# Patient Record
Sex: Male | Born: 1950
Health system: Southern US, Community
[De-identification: ages and names within clinical notes are randomized; demographics above are authoritative.]

## PROBLEM LIST (undated history)

## (undated) DIAGNOSIS — E785 Hyperlipidemia, unspecified: Secondary | ICD-10-CM

## (undated) DIAGNOSIS — I10 Essential (primary) hypertension: Secondary | ICD-10-CM

## (undated) DIAGNOSIS — E119 Type 2 diabetes mellitus without complications: Secondary | ICD-10-CM

## (undated) HISTORY — PX: COLON SURGERY: SHX602

## (undated) HISTORY — PX: OTHER SURGICAL HISTORY: SHX169

---

## 2008-02-10 ENCOUNTER — Ambulatory Visit: Payer: Self-pay | Admitting: Gastroenterology

## 2008-04-05 ENCOUNTER — Other Ambulatory Visit: Payer: Self-pay

## 2008-04-05 ENCOUNTER — Ambulatory Visit: Payer: Self-pay | Admitting: Surgery

## 2008-04-12 ENCOUNTER — Inpatient Hospital Stay: Payer: Self-pay | Admitting: Surgery

## 2016-08-03 DIAGNOSIS — Z23 Encounter for immunization: Secondary | ICD-10-CM | POA: Diagnosis not present

## 2017-01-15 DIAGNOSIS — E1140 Type 2 diabetes mellitus with diabetic neuropathy, unspecified: Secondary | ICD-10-CM | POA: Diagnosis not present

## 2017-01-15 DIAGNOSIS — G4700 Insomnia, unspecified: Secondary | ICD-10-CM | POA: Diagnosis not present

## 2017-01-15 DIAGNOSIS — E1165 Type 2 diabetes mellitus with hyperglycemia: Secondary | ICD-10-CM | POA: Diagnosis not present

## 2017-01-15 DIAGNOSIS — I10 Essential (primary) hypertension: Secondary | ICD-10-CM | POA: Diagnosis not present

## 2017-05-01 ENCOUNTER — Encounter: Payer: Self-pay | Admitting: Emergency Medicine

## 2017-05-01 ENCOUNTER — Emergency Department
Admission: EM | Admit: 2017-05-01 | Discharge: 2017-05-01 | Disposition: A | Discharge status: Home / Self Care | Payer: BLUE CROSS/BLUE SHIELD | Attending: Emergency Medicine | Admitting: Emergency Medicine

## 2017-05-01 DIAGNOSIS — F17200 Nicotine dependence, unspecified, uncomplicated: Secondary | ICD-10-CM | POA: Insufficient documentation

## 2017-05-01 DIAGNOSIS — S0501XA Injury of conjunctiva and corneal abrasion without foreign body, right eye, initial encounter: Secondary | ICD-10-CM | POA: Diagnosis not present

## 2017-05-01 DIAGNOSIS — Y93H2 Activity, gardening and landscaping: Secondary | ICD-10-CM | POA: Insufficient documentation

## 2017-05-01 DIAGNOSIS — Y92007 Garden or yard of unspecified non-institutional (private) residence as the place of occurrence of the external cause: Secondary | ICD-10-CM | POA: Diagnosis not present

## 2017-05-01 DIAGNOSIS — E119 Type 2 diabetes mellitus without complications: Secondary | ICD-10-CM | POA: Diagnosis not present

## 2017-05-01 DIAGNOSIS — X58XXXA Exposure to other specified factors, initial encounter: Secondary | ICD-10-CM | POA: Insufficient documentation

## 2017-05-01 DIAGNOSIS — I10 Essential (primary) hypertension: Secondary | ICD-10-CM | POA: Insufficient documentation

## 2017-05-01 DIAGNOSIS — Y998 Other external cause status: Secondary | ICD-10-CM | POA: Diagnosis not present

## 2017-05-01 DIAGNOSIS — S0591XA Unspecified injury of right eye and orbit, initial encounter: Secondary | ICD-10-CM | POA: Diagnosis present

## 2017-05-01 HISTORY — DX: Essential (primary) hypertension: I10

## 2017-05-01 HISTORY — DX: Type 2 diabetes mellitus without complications: E11.9

## 2017-05-01 HISTORY — DX: Hyperlipidemia, unspecified: E78.5

## 2017-05-01 MED ORDER — ERYTHROMYCIN 5 MG/GM OP OINT
TOPICAL_OINTMENT | Freq: Once | OPHTHALMIC | Status: AC
  Administered 2017-05-01: 1 via OPHTHALMIC
  Filled 2017-05-01: qty 1

## 2017-05-01 MED ORDER — TETRACAINE HCL 0.5 % OP SOLN
OPHTHALMIC | Status: AC
  Filled 2017-05-01: qty 2

## 2017-05-01 MED ORDER — ERYTHROMYCIN 5 MG/GM OP OINT: 1.0000 "application " | TOPICAL_OINTMENT | Freq: Four times a day (QID) | OPHTHALMIC | 0 refills | Status: AC

## 2017-05-01 MED ORDER — FLUORESCEIN SODIUM 0.6 MG OP STRP
ORAL_STRIP | OPHTHALMIC | Status: AC
  Filled 2017-05-01: qty 1

## 2017-05-01 MED ORDER — FLUORESCEIN SODIUM 0.6 MG OP STRP
1.0000 | ORAL_STRIP | Freq: Once | OPHTHALMIC | Status: AC
  Administered 2017-05-01: 1 via OPHTHALMIC

## 2017-05-01 MED ORDER — TETRACAINE HCL 0.5 % OP SOLN
1.0000 [drp] | Freq: Once | OPHTHALMIC | Status: AC
  Administered 2017-05-01: 1 [drp] via OPHTHALMIC

## 2017-05-01 NOTE — ED Provider Notes (Signed)
[facility] Emergency Department Provider Note   ____________________________________________   I have reviewed the triage vital signs and the nursing notes.   HISTORY  Chief Complaint Eye Problem    HPI Matthew Gay is a 66 y.o. male presents with right eye pain, eyelid swelling that began this morning when a foreign object hit his eye while mowing the grass. Patient reported initially mild discomfort without change in visual acuity however, as the day progressed pain increased with eyelid swelling. Patient denies any change in visual acuity throughout the day. He also notes no watering of the eye or drainage.Patient denies fever, chills, headache, vision changes, chest pain, chest tightness, shortness of breath, abdominal pain, nausea and vomiting.  Past Medical History:  Diagnosis Date  . Diabetes mellitus without complication (HCC)   . Hyperlipemia   . Hypertension     There are no active problems to display for this patient.   Past Surgical History:  Procedure Laterality Date  . COLON SURGERY      Prior to Admission medications   Medication Sig Start Date End Date Taking? Authorizing Provider  erythromycin ophthalmic ointment Place 1 application into the right eye 4 (four) times daily. 05/01/17 05/06/17  Meher Kucinski, Jordan Likesraci M, PA-C    Allergies Patient has no known allergies.  No family history on file.  Social History Social History  Substance Use Topics  . Smoking status: Current Some Day Smoker  . Smokeless tobacco: Never Used  . Alcohol use Yes     Comment: Rarely    Review of Systems Constitutional: Negative for fever/chills Eyes: Right eye pain. Intact visual acuity.  ENT:  Negative for sore throat and for difficulty swallowing Cardiovascular: Denies chest pain. Respiratory: Denies cough Denies shortness of breath. Gastrointestinal: No abdominal pain. Musculoskeletal: Negative for back pain. Negative for generalized body  aches. Skin: Negative for rash. Neurological: Negative for headaches.  Negative focal weakness or numbness. Negative for loss of consciousness. Able to ambulate. ____________________________________________   PHYSICAL EXAM:  VITAL SIGNS: ED Triage Vitals  Enc Vitals Group     BP 05/01/17 2037 116/71     Pulse Rate 05/01/17 2037 98     Resp --      Temp 05/01/17 2037 97.9 F (36.6 C)     Temp Source 05/01/17 2037 Oral     SpO2 05/01/17 2037 97 %     Weight 05/01/17 2038 205 lb (93 kg)     Height 05/01/17 2038 5\' 9"  (1.753 m)     Head Circumference --      Peak Flow --      Pain Score 05/01/17 2034 6     Pain Loc --      Pain Edu? --      Excl. in GC? --     Constitutional: Alert and oriented. Well appearing and in no acute distress.  Head: Normocephalic and atraumatic. Eyes: Right eye subconjunctival hemorrhage.Marland Kitchen. PERRL. Normal extraocular movements. No evidence of papilledema on limited exam. Intact peripheral vision.  Nose: No congestion/rhinorrhea Mouth/Throat: Mucous membranes are moist. Cardiovascular: Normal rate, regular rhythm. Normal distal pulses. Respiratory: Normal respiratory effort. Gastrointestinal: Soft and nontender. Musculoskeletal: Nontender with normal range of motion in all extremities. Neurologic: Normal speech and language.  Skin:  Skin is warm, dry and intact. No rash noted. Psychiatric: Mood and affect are normal.  ____________________________________________   LABS (all labs ordered are listed, but only abnormal results are displayed)  Labs Reviewed - No data to  display ____________________________________________  EKG none ____________________________________________  RADIOLOGY none ____________________________________________   PROCEDURES  Procedure(s) performed: .Fluorescein stain eye exam performed following physical exam:  Performed by: Clois Comber Authorized by: Clois Comber Consent: Verbal consent obtained. Risks and  benefits: risks, benefits and alternatives were discussed Consent given by: patient (1) drop Tetracaine instilled followed by instillation of fluorescein dye with fluorescein strip.  Examination with Joseph Art slit lamp performed: Small corneal abrasion noted at 9.00  position of right eye. No other defects noted.  Following the exam:  Right eye irrigated with Eye Stream and initial application of erythromycin ointment applied.    Patient tolerance: Patient tolerated the procedure well with no immediate complications    Critical Care performed: no ____________________________________________   INITIAL IMPRESSION / ASSESSMENT AND PLAN / ED COURSE  Pertinent labs & imaging results that were available during my care of the patient were reviewed by me and considered in my medical decision making (see chart for details).  Patient presented with right eye pain secondary to foreign body hitting his eye while mowing earlier today. Physical exam and fluorescein stain exam revealed small corneal abrasion without change in visual acuity. Eye exam was otherwise normal. Patient given initial dose of erythromycin eye ointment during his course of care in emergency department tonight. He will be given a prescription to continue the erythromycin ointment regimen for 5 days. Patient informed of clinical course, understand medical decision-making process, and agree with plan.  Patient was advised to follow up with PCP as needed and was also advised to return to the emergency department for symptoms that change or worsen.     ____________________________________________   FINAL CLINICAL IMPRESSION(S) / ED DIAGNOSES  Final diagnoses:  Abrasion of right cornea, initial encounter       NEW MEDICATIONS STARTED DURING THIS VISIT:  Discharge Medication List as of 05/01/2017 10:05 PM    START taking these medications   Details  erythromycin ophthalmic ointment Place 1 application into the right eye 4  (four) times daily., Starting Sat 05/01/2017, Until Thu 05/06/2017, Print         Note:  This document was prepared using Dragon voice recognition software and may include unintentional dictation errors.   Ceazia Harb, Jordan Likes, PA-C 05/01/17 2320    Nita Sickle, MD 05/02/17 0100

## 2017-05-01 NOTE — ED Triage Notes (Signed)
Pt states that he was mowing this morning and thinks that a bug got into his eye. Pt is ambulatory to triage and in NAD.

## 2017-06-17 DIAGNOSIS — I10 Essential (primary) hypertension: Secondary | ICD-10-CM | POA: Diagnosis not present

## 2017-06-17 DIAGNOSIS — M79673 Pain in unspecified foot: Secondary | ICD-10-CM | POA: Diagnosis not present

## 2017-06-17 DIAGNOSIS — E1165 Type 2 diabetes mellitus with hyperglycemia: Secondary | ICD-10-CM | POA: Diagnosis not present

## 2017-06-21 DIAGNOSIS — I10 Essential (primary) hypertension: Secondary | ICD-10-CM | POA: Diagnosis not present

## 2017-06-21 DIAGNOSIS — Z0001 Encounter for general adult medical examination with abnormal findings: Secondary | ICD-10-CM | POA: Diagnosis not present

## 2017-06-21 DIAGNOSIS — E782 Mixed hyperlipidemia: Secondary | ICD-10-CM | POA: Diagnosis not present

## 2017-06-21 DIAGNOSIS — E1140 Type 2 diabetes mellitus with diabetic neuropathy, unspecified: Secondary | ICD-10-CM | POA: Diagnosis not present

## 2017-09-03 DIAGNOSIS — Z23 Encounter for immunization: Secondary | ICD-10-CM | POA: Diagnosis not present

## 2017-12-02 ENCOUNTER — Other Ambulatory Visit: Payer: Self-pay | Admitting: Internal Medicine

## 2017-12-02 NOTE — Telephone Encounter (Signed)
Pt needs refill

## 2018-01-27 ENCOUNTER — Ambulatory Visit: Payer: BLUE CROSS/BLUE SHIELD | Admitting: Nurse Practitioner

## 2018-01-27 ENCOUNTER — Encounter: Payer: Self-pay | Admitting: Nurse Practitioner

## 2018-01-27 VITALS — BP 138/96 | HR 81 | Resp 16 | Ht 67.0 in | Wt 192.6 lb

## 2018-01-27 DIAGNOSIS — E1165 Type 2 diabetes mellitus with hyperglycemia: Secondary | ICD-10-CM

## 2018-01-27 DIAGNOSIS — F5101 Primary insomnia: Secondary | ICD-10-CM

## 2018-01-27 DIAGNOSIS — E119 Type 2 diabetes mellitus without complications: Secondary | ICD-10-CM | POA: Insufficient documentation

## 2018-01-27 DIAGNOSIS — I10 Essential (primary) hypertension: Secondary | ICD-10-CM

## 2018-01-27 MED ORDER — TRAZODONE HCL 50 MG PO TABS: 50.0000 mg | ORAL_TABLET | Freq: Every evening | ORAL | 1 refills | Status: DC | PRN

## 2018-01-27 MED ORDER — TRAZODONE HCL 50 MG PO TABS: 50.0000 mg | ORAL_TABLET | Freq: Every evening | ORAL | 3 refills | Status: DC | PRN

## 2018-01-27 MED ORDER — SITAGLIP PHOS-METFORMIN HCL ER 100-1000 MG PO TB24: 1.0000 | ORAL_TABLET | Freq: Every day | ORAL | 3 refills | Status: DC

## 2018-01-27 NOTE — Progress Notes (Signed)
[facility] [address] [place], [place] [address]  Internal MEDICINE  Office Visit Note  Patient Name: Matthew Gay  [id number]  [id number]  Date of Service: 02/16/2018  Chief Complaint  Patient presents with  . Diabetes    The patient is here for routine follow up exam. He is diabetic. States that he takes his DM medication as prescribed, twice daily. He states that he forgets the afternoon dose of Glucovance 2 to 3 times per week. Does not check his blood sugars. Does not feel bad. HgbA1c is elevated today at 10.6. This is up two points from prior visit. His labs were done in 8/018 and looked good. He has eye exam scheduled for 02/18/2018. He has no physical concerns or complaints today.    Pt is here for routine follow up.    Current Medication: Outpatient Encounter Medications as of 01/27/2018  Medication Sig  . atorvastatin (LIPITOR) 20 MG tablet Take 20 mg by mouth at bedtime.   Marland Kitchen glucose blood test strip 1 each by Other route as needed for other. Use as instructed  . lisinopril-hydrochlorothiazide (PRINZIDE,ZESTORETIC) 20-12.5 MG tablet Take 1 tablet by mouth daily.  . sildenafil (VIAGRA) 100 MG tablet Take 100 mg by mouth daily as needed for erectile dysfunction.  . tizanidine (ZANAFLEX) 2 MG capsule Take 2 mg by mouth 3 (three) times daily.  . [DISCONTINUED] glyBURIDE-metformin (GLUCOVANCE) 5-500 MG tablet Take 1 tablet by mouth 2 (two) times daily with a meal.  . gabapentin (NEURONTIN) 100 MG capsule TAKE ONE CAPSULE BY MOUTH TWICE A DAY FOR DIABETIC NEUROPATHY  . SitaGLIPtin-MetFORMIN HCl (JANUMET XR) [phone] MG TB24 Take 1 tablet by mouth daily.  . [DISCONTINUED] traZODone (DESYREL) 50 MG tablet TAKE 1 TABLET BY MOUTH AT BEDTIME AS NEEDED FOR INSOMNIA  . [DISCONTINUED] traZODone (DESYREL) 50 MG tablet Take 1 tablet (50 mg total) by mouth at bedtime as needed for sleep.  . [DISCONTINUED] traZODone (DESYREL) 50 MG tablet Take 1 tablet (50 mg total) by mouth  at bedtime as needed for sleep.  . [DISCONTINUED] traZODone (DESYREL) 50 MG tablet Take 1 tablet (50 mg total) by mouth at bedtime as needed for sleep.   No facility-administered encounter medications on file as of 01/27/2018.     Surgical History: Past Surgical History:  Procedure Laterality Date  . COLON SURGERY      Medical History: Past Medical History:  Diagnosis Date  . Diabetes mellitus without complication (HCC)   . Hyperlipemia   . Hypertension     Family History: No family history on file.  Social History   Socioeconomic History  . Marital status: Married    Spouse name: Not on file  . Number of children: Not on file  . Years of education: Not on file  . Highest education level: Not on file  Occupational History  . Not on file  Social Needs  . Financial resource strain: Not on file  . Food insecurity:    Worry: Not on file    Inability: Not on file  . Transportation needs:    Medical: Not on file    Non-medical: Not on file  Tobacco Use  . Smoking status: Current Some Day Smoker    Types: Cigarettes  . Smokeless tobacco: Never Used  Substance and Sexual Activity  . Alcohol use: Yes    Comment: Rarely  . Drug use: No  . Sexual activity: Not on file  Lifestyle  . Physical activity:    Days  per week: Not on file    Minutes per session: Not on file  . Stress: Not on file  Relationships  . Social connections:    Talks on phone: Not on file    Gets together: Not on file    Attends religious service: Not on file    Active member of club or organization: Not on file    Attends meetings of clubs or organizations: Not on file    Relationship status: Not on file  . Intimate partner violence:    Fear of current or ex partner: Not on file    Emotionally abused: Not on file    Physically abused: Not on file    Forced sexual activity: Not on file  Other Topics Concern  . Not on file  Social History Narrative  . Not on file      Review of Systems   Constitutional: Negative for activity change, chills, fatigue and unexpected weight change.  HENT: Negative for congestion, postnasal drip, rhinorrhea, sneezing and sore throat.   Eyes: Negative.  Negative for redness.  Respiratory: Negative for cough, chest tightness, shortness of breath and wheezing.   Cardiovascular: Negative for chest pain and palpitations.  Gastrointestinal: Negative for abdominal pain, constipation, diarrhea, nausea and vomiting.  Endocrine:       Blood sugars running high.   Genitourinary: Negative.  Negative for dysuria and frequency.  Musculoskeletal: Negative for arthralgias, back pain, joint swelling and neck pain.  Skin: Negative for rash.  Allergic/Immunologic: Negative for environmental allergies.  Neurological: Negative for tremors, numbness and headaches.  Hematological: Negative for adenopathy. Does not bruise/bleed easily.  Psychiatric/Behavioral: Negative for behavioral problems (Depression), sleep disturbance and suicidal ideas. The patient is not nervous/anxious.     Today's Vitals   01/27/18 1531  BP: (!) 138/96  Pulse: 81  Resp: 16  SpO2: 98%  Weight: 192 lb 9.6 oz (87.4 kg)  Height: 5\' 7"  (1.702 m)    Physical Exam  Constitutional: He is oriented to person, place, and time. He appears well-developed and well-nourished. No distress.  HENT:  Head: Normocephalic and atraumatic.  Mouth/Throat: Oropharynx is clear and moist. No oropharyngeal exudate.  Eyes: Pupils are equal, round, and reactive to light. EOM are normal.  Neck: Normal range of motion. Neck supple. No JVD present. Carotid bruit is not present. No tracheal deviation present. No thyromegaly present.  Cardiovascular: Normal rate, regular rhythm and normal heart sounds. Exam reveals no gallop and no friction rub.  No murmur heard. Pulmonary/Chest: Effort normal and breath sounds normal. No respiratory distress. He has no wheezes. He has no rales. He exhibits no tenderness.   Abdominal: Soft. Bowel sounds are normal. There is no tenderness.  Musculoskeletal: Normal range of motion.  Lymphadenopathy:    He has no cervical adenopathy.  Neurological: He is alert and oriented to person, place, and time. No cranial nerve deficit.  Skin: Skin is warm and dry. He is not diaphoretic.  Psychiatric: He has a normal mood and affect. His behavior is normal. Judgment and thought content normal.  Nursing note and vitals reviewed.   Assessment/Plan: 1. Uncontrolled type 2 diabetes mellitus with hyperglycemia (HCC) - POCT HgB A1C 10.6 today. Restart Janumet XR 100/1000mg  daily. Samples and new prescription provided. Reviewed ADA diet. Encouraged him to increase routine exercise. - SitaGLIPtin-MetFORMIN HCl (JANUMET XR) [phone] MG TB24; Take 1 tablet by mouth daily.  Dispense: 30 tablet; Refill: 3  2. Primary insomnia May take prescribed trazodone as needed and as  prescribed  3. Essential hypertension Generally stable. Continue bp medication as prescribed   General Counseling: Matthew Gay verbalizes understanding of the findings of todays visit and agrees with plan of treatment. I have discussed any further diagnostic evaluation that may be needed or ordered today. We also reviewed his medications today. he has been encouraged to call the office with any questions or concerns that should arise related to todays visit.  This patient was seen by Vincent GrosHeather Darothy Courtright, FNP- C in Collaboration with Dr Lyndon CodeFozia M Khan as a part of collaborative care agreement    Orders Placed This Encounter  Procedures  . POCT HgB A1C    Meds ordered this encounter  Medications  . SitaGLIPtin-MetFORMIN HCl (JANUMET XR) [phone] MG TB24    Sig: Take 1 tablet by mouth daily.    Dispense:  30 tablet    Refill:  3    Please note that patient's DM medication is changing. Please d/c glucovance BID. Thanks.    Order Specific Question:   Supervising Provider    Answer:   Lyndon CodeKHAN, FOZIA M [[id number]]  . DISCONTD:  traZODone (DESYREL) 50 MG tablet    Sig: Take 1 tablet (50 mg total) by mouth at bedtime as needed for sleep.    Dispense:  30 tablet    Refill:  3    Order Specific Question:   Supervising Provider    Answer:   Lyndon CodeKHAN, FOZIA M [[id number]]  . DISCONTD: traZODone (DESYREL) 50 MG tablet    Sig: Take 1 tablet (50 mg total) by mouth at bedtime as needed for sleep.    Dispense:  30 tablet    Refill:  1    Order Specific Question:   Supervising Provider    Answer:   Lyndon CodeKHAN, FOZIA M [[id number]]  . DISCONTD: traZODone (DESYREL) 50 MG tablet    Sig: Take 1 tablet (50 mg total) by mouth at bedtime as needed for sleep.    Dispense:  30 tablet    Refill:  1    Order Specific Question:   Supervising Provider    Answer:   Lyndon CodeKHAN, FOZIA M [[id number]]    Time spent: 6825 Minutes     Dr Lyndon CodeFozia M Khan Internal medicine

## 2018-02-16 DIAGNOSIS — F5101 Primary insomnia: Secondary | ICD-10-CM | POA: Insufficient documentation

## 2018-02-16 DIAGNOSIS — I10 Essential (primary) hypertension: Secondary | ICD-10-CM | POA: Insufficient documentation

## 2018-02-16 DIAGNOSIS — E1165 Type 2 diabetes mellitus with hyperglycemia: Secondary | ICD-10-CM | POA: Insufficient documentation

## 2018-02-16 LAB — POCT GLYCOSYLATED HEMOGLOBIN (HGB A1C): Hemoglobin A1C: 10.6

## 2018-02-18 ENCOUNTER — Other Ambulatory Visit: Payer: Self-pay | Admitting: Nurse Practitioner

## 2018-02-18 ENCOUNTER — Telehealth: Payer: Self-pay

## 2018-02-18 DIAGNOSIS — N529 Male erectile dysfunction, unspecified: Secondary | ICD-10-CM

## 2018-02-18 MED ORDER — SILDENAFIL CITRATE 100 MG PO TABS: 100.0000 mg | ORAL_TABLET | Freq: Every day | ORAL | 3 refills | Status: DC | PRN

## 2018-02-18 NOTE — Telephone Encounter (Signed)
Request sent to heather boscia

## 2018-02-18 NOTE — Progress Notes (Signed)
Refilled patient's prescription for viagra and sent to pharmacy.

## 2018-02-18 NOTE — Telephone Encounter (Signed)
Spoke to pt and informed him that rx was sent to pharmacy.  dbs

## 2018-02-18 NOTE — Telephone Encounter (Signed)
Refilled patient's prescription for viagra and sent to pharmacy.

## 2018-02-21 ENCOUNTER — Other Ambulatory Visit: Payer: Self-pay

## 2018-02-21 DIAGNOSIS — N529 Male erectile dysfunction, unspecified: Secondary | ICD-10-CM

## 2018-02-21 MED ORDER — SILDENAFIL CITRATE 100 MG PO TABS: 100.0000 mg | ORAL_TABLET | Freq: Every day | ORAL | 3 refills | Status: DC | PRN

## 2018-03-23 ENCOUNTER — Other Ambulatory Visit: Payer: Self-pay

## 2018-03-23 MED ORDER — ONETOUCH ULTRA MINI W/DEVICE KIT: PACK | 0 refills | Status: AC

## 2018-03-24 ENCOUNTER — Other Ambulatory Visit: Payer: Self-pay | Admitting: Internal Medicine

## 2018-04-04 ENCOUNTER — Telehealth: Payer: Self-pay

## 2018-04-04 NOTE — Telephone Encounter (Signed)
The patient has appointment on 05/05/2018. I have samples for what he is on to last until then. They are in brown paper bag on my desk. He needs to pick them up and take 1 tablet daily. We can discuss changes at his appointment. Thanks

## 2018-04-04 NOTE — Telephone Encounter (Signed)
Note sent

## 2018-04-04 NOTE — Telephone Encounter (Signed)
Spoke to pt and advised that we had samples he could pick up to last him til his appt.  Pt advised that he did get his rx filled and has enough to last til appt.  He will discuss at 05/05/18 appt.  dbs

## 2018-05-05 ENCOUNTER — Ambulatory Visit: Payer: BLUE CROSS/BLUE SHIELD | Admitting: Nurse Practitioner

## 2018-05-09 ENCOUNTER — Telehealth: Payer: Self-pay

## 2018-05-09 ENCOUNTER — Other Ambulatory Visit: Payer: Self-pay

## 2018-05-09 NOTE — Telephone Encounter (Signed)
Pt called that janumet is expensive as per heather pt advised thaqt we can change on next visit samoles ready for pickup

## 2018-05-09 NOTE — Telephone Encounter (Signed)
Giving samples until next visit. Thanks.

## 2018-05-24 ENCOUNTER — Ambulatory Visit: Payer: BLUE CROSS/BLUE SHIELD | Admitting: Nurse Practitioner

## 2018-06-09 ENCOUNTER — Ambulatory Visit: Payer: Self-pay | Admitting: Nurse Practitioner

## 2018-06-16 ENCOUNTER — Ambulatory Visit (INDEPENDENT_AMBULATORY_CARE_PROVIDER_SITE_OTHER): Payer: BLUE CROSS/BLUE SHIELD | Admitting: Adult Health

## 2018-06-16 ENCOUNTER — Other Ambulatory Visit: Payer: Self-pay | Admitting: Adult Health

## 2018-06-16 ENCOUNTER — Encounter: Payer: Self-pay | Admitting: Adult Health

## 2018-06-16 VITALS — BP 128/70 | HR 80 | Resp 16 | Ht 68.0 in | Wt 178.2 lb

## 2018-06-16 DIAGNOSIS — I10 Essential (primary) hypertension: Secondary | ICD-10-CM | POA: Diagnosis not present

## 2018-06-16 DIAGNOSIS — E1165 Type 2 diabetes mellitus with hyperglycemia: Secondary | ICD-10-CM

## 2018-06-16 DIAGNOSIS — N529 Male erectile dysfunction, unspecified: Secondary | ICD-10-CM | POA: Diagnosis not present

## 2018-06-16 DIAGNOSIS — F17200 Nicotine dependence, unspecified, uncomplicated: Secondary | ICD-10-CM

## 2018-06-16 LAB — POCT GLYCOSYLATED HEMOGLOBIN (HGB A1C): Hemoglobin A1C: 10.4 % — AB (ref 4.0–5.6)

## 2018-06-16 MED ORDER — SILDENAFIL CITRATE 100 MG PO TABS: 100.0000 mg | ORAL_TABLET | Freq: Every day | ORAL | 3 refills | Status: DC | PRN

## 2018-06-16 MED ORDER — METFORMIN HCL 500 MG PO TABS: 500.0000 mg | ORAL_TABLET | Freq: Two times a day (BID) | ORAL | 0 refills | Status: DC

## 2018-06-16 MED ORDER — PNEUMOCOCCAL 13-VAL CONJ VACC IM SUSP: 0.5000 mL | INTRAMUSCULAR | 0 refills | Status: AC

## 2018-06-16 NOTE — Patient Instructions (Signed)
Diabetes Mellitus and Nutrition When you have diabetes (diabetes mellitus), it is very important to have healthy eating habits because your blood sugar (glucose) levels are greatly affected by what you eat and drink. Eating healthy foods in the appropriate amounts, at about the same times every day, can help you:  Control your blood glucose.  Lower your risk of heart disease.  Improve your blood pressure.  Reach or maintain a healthy weight.  Every person with diabetes is different, and each person has different needs for a meal plan. Your health care provider may recommend that you work with a diet and nutrition specialist (dietitian) to make a meal plan that is best for you. Your meal plan may vary depending on factors such as:  The calories you need.  The medicines you take.  Your weight.  Your blood glucose, blood pressure, and cholesterol levels.  Your activity level.  Other health conditions you have, such as heart or kidney disease.  How do carbohydrates affect me? Carbohydrates affect your blood glucose level more than any other type of food. Eating carbohydrates naturally increases the amount of glucose in your blood. Carbohydrate counting is a method for keeping track of how many carbohydrates you eat. Counting carbohydrates is important to keep your blood glucose at a healthy level, especially if you use insulin or take certain oral diabetes medicines. It is important to know how many carbohydrates you can safely have in each meal. This is different for every person. Your dietitian can help you calculate how many carbohydrates you should have at each meal and for snack. Foods that contain carbohydrates include:  Bread, cereal, rice, pasta, and crackers.  Potatoes and corn.  Peas, beans, and lentils.  Milk and yogurt.  Fruit and juice.  Desserts, such as cakes, cookies, ice cream, and candy.  How does alcohol affect me? Alcohol can cause a sudden decrease in blood  glucose (hypoglycemia), especially if you use insulin or take certain oral diabetes medicines. Hypoglycemia can be a life-threatening condition. Symptoms of hypoglycemia (sleepiness, dizziness, and confusion) are similar to symptoms of having too much alcohol. If your health care provider says that alcohol is safe for you, follow these guidelines:  Limit alcohol intake to no more than 1 drink per day for nonpregnant women and 2 drinks per day for men. One drink equals 12 oz of beer, 5 oz of wine, or 1 oz of hard liquor.  Do not drink on an empty stomach.  Keep yourself hydrated with water, diet soda, or unsweetened iced tea.  Keep in mind that regular soda, juice, and other mixers may contain a lot of sugar and must be counted as carbohydrates.  What are tips for following this plan? Reading food labels  Start by checking the serving size on the label. The amount of calories, carbohydrates, fats, and other nutrients listed on the label are based on one serving of the food. Many foods contain more than one serving per package.  Check the total grams (g) of carbohydrates in one serving. You can calculate the number of servings of carbohydrates in one serving by dividing the total carbohydrates by 15. For example, if a food has 30 g of total carbohydrates, it would be equal to 2 servings of carbohydrates.  Check the number of grams (g) of saturated and trans fats in one serving. Choose foods that have low or no amount of these fats.  Check the number of milligrams (mg) of sodium in one serving. Most people   should limit total sodium intake to less than 2,300 mg per day.  Always check the nutrition information of foods labeled as "low-fat" or "nonfat". These foods may be higher in added sugar or refined carbohydrates and should be avoided.  Talk to your dietitian to identify your daily goals for nutrients listed on the label. Shopping  Avoid buying canned, premade, or processed foods. These  foods tend to be high in fat, sodium, and added sugar.  Shop around the outside edge of the grocery store. This includes fresh fruits and vegetables, bulk grains, fresh meats, and fresh dairy. Cooking  Use low-heat cooking methods, such as baking, instead of high-heat cooking methods like deep frying.  Cook using healthy oils, such as olive, canola, or sunflower oil.  Avoid cooking with butter, cream, or high-fat meats. Meal planning  Eat meals and snacks regularly, preferably at the same times every day. Avoid going long periods of time without eating.  Eat foods high in fiber, such as fresh fruits, vegetables, beans, and whole grains. Talk to your dietitian about how many servings of carbohydrates you can eat at each meal.  Eat 4-6 ounces of lean protein each day, such as lean meat, chicken, fish, eggs, or tofu. 1 ounce is equal to 1 ounce of meat, chicken, or fish, 1 egg, or 1/4 cup of tofu.  Eat some foods each day that contain healthy fats, such as avocado, nuts, seeds, and fish. Lifestyle   Check your blood glucose regularly.  Exercise at least 30 minutes 5 or more days each week, or as told by your health care provider.  Take medicines as told by your health care provider.  Do not use any products that contain nicotine or tobacco, such as cigarettes and e-cigarettes. If you need help quitting, ask your health care provider.  Work with a counselor or diabetes educator to identify strategies to manage stress and any emotional and social challenges. What are some questions to ask my health care provider?  Do I need to meet with a diabetes educator?  Do I need to meet with a dietitian?  What number can I call if I have questions?  When are the best times to check my blood glucose? Where to find more information:  American Diabetes Association: diabetes.org/food-and-fitness/food  Academy of Nutrition and Dietetics:  www.eatright.org/resources/health/diseases-and-conditions/diabetes  National Institute of Diabetes and Digestive and Kidney Diseases (NIH): www.niddk.nih.gov/health-information/diabetes/overview/diet-eating-physical-activity Summary  A healthy meal plan will help you control your blood glucose and maintain a healthy lifestyle.  Working with a diet and nutrition specialist (dietitian) can help you make a meal plan that is best for you.  Keep in mind that carbohydrates and alcohol have immediate effects on your blood glucose levels. It is important to count carbohydrates and to use alcohol carefully. This information is not intended to replace advice given to you by your health care provider. Make sure you discuss any questions you have with your health care provider. Document Released: 07/30/2005 Document Revised: 12/07/2016 Document Reviewed: 12/07/2016 Elsevier Interactive Patient Education  2018 [employer]  

## 2018-06-16 NOTE — Progress Notes (Signed)
[facility] [place], [place] [address]  Internal MEDICINE  Office Visit Note  Patient Name: Matthew Gay  [id number]  [id number]  Date of Service: 06/16/2018  Chief Complaint  Patient presents with  . Diabetes  . Hypertension  . Hyperlipidemia  . Quality Metric Gaps    foot exam, eye exam, colonoscopy , prevnar ordered     Diabetes  He presents for his follow-up diabetic visit. He has type 2 diabetes mellitus. His disease course has been worsening. Pertinent negatives for hypoglycemia include no nervousness/anxiousness or tremors. Pertinent negatives for diabetes include no chest pain, no fatigue, no foot paresthesias, no polydipsia and no polyphagia. Symptoms are stable. Diabetic complications include impotence.  Hypertension  This is a chronic problem. The current episode started more than 1 year ago. The problem is unchanged. Pertinent negatives include no chest pain, neck pain, palpitations or shortness of breath.  Hyperlipidemia  This is a chronic problem. The current episode started more than 1 year ago. The problem is controlled. Recent lipid tests were reviewed and are variable. Pertinent negatives include no chest pain or shortness of breath.   Pt here reports he needs medicine for his diabetes.  He reports the Janumet he was on is too expensive.  He would like to go back on his metformin.  Pt reports he has cut down to drinking 1-2 sodas a day.  He reports he intermittently takes his blood sugar at home, and is getting readings in the 300-400 range.  Pt has compliance issues with medication.       Current Medication: Outpatient Encounter Medications as of 06/16/2018  Medication Sig  . atorvastatin (LIPITOR) 20 MG tablet Take 20 mg by mouth at bedtime.   . Blood Glucose Monitoring Suppl (ONE TOUCH ULTRA MINI) w/Device KIT Use as directed diagnosis e11.65  . gabapentin (NEURONTIN) 100 MG capsule TAKE ONE CAPSULE BY MOUTH TWICE A DAY FOR DIABETIC  NEUROPATHY  . glucose blood test strip 1 each by Other route as needed for other. Use as instructed  . lisinopril-hydrochlorothiazide (PRINZIDE,ZESTORETIC) 20-12.5 MG tablet Take One PO QD for blood pressure,  . sildenafil (VIAGRA) 100 MG tablet Take 1 tablet (100 mg total) by mouth daily as needed for erectile dysfunction.  . tizanidine (ZANAFLEX) 2 MG capsule Take 2 mg by mouth 3 (three) times daily.  . SitaGLIPtin-MetFORMIN HCl (JANUMET XR) [phone] MG TB24 Take 1 tablet by mouth daily.   No facility-administered encounter medications on file as of 06/16/2018.     Surgical History: Past Surgical History:  Procedure Laterality Date  . COLON SURGERY      Medical History: Past Medical History:  Diagnosis Date  . Diabetes mellitus without complication ([place])   . Hyperlipemia   . Hypertension     Family History: Family History  Family history unknown: Yes    Social History   Socioeconomic History  . Marital status: Married    Spouse name: Not on file  . Number of children: Not on file  . Years of education: Not on file  . Highest education level: Not on file  Occupational History  . Not on file  Social Needs  . Financial resource strain: Not on file  . Food insecurity:    Worry: Not on file    Inability: Not on file  . Transportation needs:    Medical: Not on file    Non-medical: Not on file  Tobacco Use  . Smoking status: Current Some Day Smoker  Types: Cigarettes  . Smokeless tobacco: Never Used  Substance and Sexual Activity  . Alcohol use: Yes    Comment: Rarely  . Drug use: No  . Sexual activity: Not on file  Lifestyle  . Physical activity:    Days per week: Not on file    Minutes per session: Not on file  . Stress: Not on file  Relationships  . Social connections:    Talks on phone: Not on file    Gets together: Not on file    Attends religious service: Not on file    Active member of club or organization: Not on file    Attends meetings of clubs  or organizations: Not on file    Relationship status: Not on file  . Intimate partner violence:    Fear of current or ex partner: Not on file    Emotionally abused: Not on file    Physically abused: Not on file    Forced sexual activity: Not on file  Other Topics Concern  . Not on file  Social History Narrative  . Not on file      Review of Systems  Constitutional: Negative.  Negative for chills, fatigue and unexpected weight change.  HENT: Negative.  Negative for congestion, rhinorrhea, sneezing and sore throat.   Eyes: Negative for redness.  Respiratory: Negative.  Negative for cough, chest tightness and shortness of breath.   Cardiovascular: Negative.  Negative for chest pain and palpitations.  Gastrointestinal: Negative.  Negative for abdominal pain, constipation, diarrhea, nausea and vomiting.  Endocrine: Negative.  Negative for polydipsia and polyphagia.  Genitourinary: Positive for impotence. Negative for dysuria and frequency.  Musculoskeletal: Negative.  Negative for arthralgias, back pain, joint swelling and neck pain.  Skin: Negative.  Negative for rash.  Allergic/Immunologic: Negative.   Neurological: Negative.  Negative for tremors and numbness.  Hematological: Negative for adenopathy. Does not bruise/bleed easily.  Psychiatric/Behavioral: Negative.  Negative for behavioral problems, sleep disturbance and suicidal ideas. The patient is not nervous/anxious.     Vital Signs: BP 128/70   Pulse 80   Resp 16   Ht '5\' 8"'  (1.727 m)   Wt 178 lb 3.2 oz (80.8 kg)   SpO2 98%   BMI 27.10 kg/m    Physical Exam  Constitutional: He is oriented to person, place, and time. He appears well-developed and well-nourished. No distress.  HENT:  Head: Normocephalic and atraumatic.  Mouth/Throat: Oropharynx is clear and moist. No oropharyngeal exudate.  Eyes: Pupils are equal, round, and reactive to light. EOM are normal.  Neck: Normal range of motion. Neck supple. No JVD present.  No tracheal deviation present. No thyromegaly present.  Cardiovascular: Normal rate, regular rhythm and normal heart sounds. Exam reveals no gallop and no friction rub.  No murmur heard. Pulmonary/Chest: Effort normal and breath sounds normal. No respiratory distress. He has no wheezes. He has no rales. He exhibits no tenderness.  Abdominal: Soft. There is no tenderness. There is no guarding.  Musculoskeletal: Normal range of motion.  Lymphadenopathy:    He has no cervical adenopathy.  Neurological: He is alert and oriented to person, place, and time. No cranial nerve deficit.  Skin: Skin is warm and dry. He is not diaphoretic.  Psychiatric: He has a normal mood and affect. His behavior is normal. Judgment and thought content normal.  Nursing note and vitals reviewed.  Assessment/Plan: 1. Uncontrolled type 2 diabetes mellitus with hyperglycemia ([place]) Return in 3 months for HgA1c recheck. Start taking Metformin.  Discussed medication compliance with patient.  - POCT HgB A1C - metFORMIN (GLUCOPHAGE) 500 MG tablet; Take 1 tablet (500 mg total) by mouth 2 (two) times daily with a meal.  Dispense: 180 tablet; Refill: 0  2. Essential hypertension Controlled at this time.  Continue therapy.   3. Erectile dysfunction, unspecified erectile dysfunction type Use Sildenafil with caution. - sildenafil (VIAGRA) 100 MG tablet; Take 1 tablet (100 mg total) by mouth daily as needed for erectile dysfunction.  Dispense: 10 tablet; Refill: 3  4. Smoking Smoking cessation counseling: 1. Pt acknowledges the risks of long term smoking, she will try to quite smoking. 2. Options for different medications including nicotine products, chewing gum, patch etc, Wellbutrin and Chantix is discussed 3. Goal and date of compete cessation is discussed 4. Total time spent in smoking cessation is 15 min.   General Counseling: Neill verbalizes understanding of the findings of todays visit and agrees with plan of  treatment. I have discussed any further diagnostic evaluation that may be needed or ordered today. We also reviewed his medications today. he has been encouraged to call the office with any questions or concerns that should arise related to todays visit.    Orders Placed This Encounter  Procedures  . POCT HgB A1C    No orders of the defined types were placed in this encounter.   Time spent: 25  Minutes   This patient was seen by Orson Gear AGNP-C in Collaboration with Dr Lavera Guise as a part of collaborative care agreement    Dr Lavera Guise Internal medicine

## 2018-06-20 ENCOUNTER — Encounter: Payer: Self-pay | Admitting: Emergency Medicine

## 2018-06-20 ENCOUNTER — Emergency Department
Admission: EM | Admit: 2018-06-20 | Discharge: 2018-06-20 | Disposition: A | Discharge status: Home / Self Care | Payer: BLUE CROSS/BLUE SHIELD | Attending: Emergency Medicine | Admitting: Emergency Medicine

## 2018-06-20 ENCOUNTER — Other Ambulatory Visit: Payer: Self-pay

## 2018-06-20 DIAGNOSIS — Y999 Unspecified external cause status: Secondary | ICD-10-CM | POA: Diagnosis not present

## 2018-06-20 DIAGNOSIS — E119 Type 2 diabetes mellitus without complications: Secondary | ICD-10-CM | POA: Insufficient documentation

## 2018-06-20 DIAGNOSIS — K0889 Other specified disorders of teeth and supporting structures: Secondary | ICD-10-CM

## 2018-06-20 DIAGNOSIS — S025XXA Fracture of tooth (traumatic), initial encounter for closed fracture: Secondary | ICD-10-CM | POA: Insufficient documentation

## 2018-06-20 DIAGNOSIS — Z85038 Personal history of other malignant neoplasm of large intestine: Secondary | ICD-10-CM | POA: Diagnosis not present

## 2018-06-20 DIAGNOSIS — X58XXXA Exposure to other specified factors, initial encounter: Secondary | ICD-10-CM | POA: Insufficient documentation

## 2018-06-20 DIAGNOSIS — Z79899 Other long term (current) drug therapy: Secondary | ICD-10-CM | POA: Insufficient documentation

## 2018-06-20 DIAGNOSIS — I10 Essential (primary) hypertension: Secondary | ICD-10-CM | POA: Diagnosis not present

## 2018-06-20 DIAGNOSIS — K029 Dental caries, unspecified: Secondary | ICD-10-CM | POA: Diagnosis not present

## 2018-06-20 DIAGNOSIS — Y939 Activity, unspecified: Secondary | ICD-10-CM | POA: Diagnosis not present

## 2018-06-20 DIAGNOSIS — F17210 Nicotine dependence, cigarettes, uncomplicated: Secondary | ICD-10-CM | POA: Diagnosis not present

## 2018-06-20 DIAGNOSIS — Y929 Unspecified place or not applicable: Secondary | ICD-10-CM | POA: Insufficient documentation

## 2018-06-20 DIAGNOSIS — K0381 Cracked tooth: Secondary | ICD-10-CM | POA: Diagnosis not present

## 2018-06-20 MED ORDER — IBUPROFEN 800 MG PO TABS: 800.0000 mg | ORAL_TABLET | Freq: Three times a day (TID) | ORAL | 0 refills | Status: DC | PRN

## 2018-06-20 MED ORDER — LIDOCAINE VISCOUS HCL 2 % MT SOLN
15.0000 mL | Freq: Once | OROMUCOSAL | Status: AC
Start: 2018-06-20 — End: 2018-06-20
  Administered 2018-06-20: 15 mL via OROMUCOSAL
  Filled 2018-06-20: qty 15

## 2018-06-20 MED ORDER — AMOXICILLIN 500 MG PO CAPS
500.0000 mg | ORAL_CAPSULE | Freq: Once | ORAL | Status: AC
  Administered 2018-06-20: 500 mg via ORAL
  Filled 2018-06-20: qty 1

## 2018-06-20 MED ORDER — IBUPROFEN 800 MG PO TABS
800.0000 mg | ORAL_TABLET | Freq: Once | ORAL | Status: AC
  Administered 2018-06-20: 800 mg via ORAL
  Filled 2018-06-20: qty 1

## 2018-06-20 MED ORDER — HYDROCODONE-ACETAMINOPHEN 5-325 MG PO TABS: 1.0000 | ORAL_TABLET | Freq: Four times a day (QID) | ORAL | 0 refills | Status: DC | PRN

## 2018-06-20 MED ORDER — AMOXICILLIN 500 MG PO CAPS: 500.0000 mg | ORAL_CAPSULE | Freq: Three times a day (TID) | ORAL | 0 refills | Status: DC

## 2018-06-20 NOTE — ED Provider Notes (Signed)
[facility] Emergency Department Provider Note   ____________________________________________   First MD Initiated Contact with Patient 06/20/18 0330     (approximate)  I have reviewed the triage vital signs and the nursing notes.   HISTORY  Chief Complaint Dental Pain    HPI Matthew Gay is a 67 y.o. male who presents to the ED from home with a chief complaint of toothache.  Patient reports he has broken teeth already but started hurting to the bottom right side of his mouth 2 days ago.  Denies associated fever, chills, swelling, chest pain, shortness of breath, abdominal pain, nausea or vomiting.  Denies recent trauma.   Past Medical History:  Diagnosis Date  . Diabetes mellitus without complication ([place])   . Hyperlipemia   . Hypertension     Patient Active Problem List   Diagnosis Date Noted  . Uncontrolled type 2 diabetes mellitus with hyperglycemia ([place]) 02/16/2018  . Primary insomnia 02/16/2018  . Hypertension 02/16/2018  . Diabetes mellitus without complication ([place]) 05/11/9484    Past Surgical History:  Procedure Laterality Date  . colon cancer    . COLON SURGERY      Prior to Admission medications   Medication Sig Start Date End Date Taking? Authorizing Provider  atorvastatin (LIPITOR) 20 MG tablet Take 20 mg by mouth at bedtime.    Yes [provider]  Blood Glucose Monitoring Suppl (ONE TOUCH ULTRA MINI) w/Device KIT Use as directed diagnosis e11.65 03/23/18  Yes Boscia, Heather E, NP  gabapentin (NEURONTIN) 100 MG capsule TAKE ONE CAPSULE BY MOUTH TWICE A DAY FOR DIABETIC NEUROPATHY 12/02/17  Yes Boscia, Heather E, NP  glucose blood test strip 1 each by Other route as needed for other. Use as instructed   Yes [provider]  lisinopril-hydrochlorothiazide (PRINZIDE,ZESTORETIC) 20-12.5 MG tablet Take One PO QD for blood pressure, 03/25/18  Yes Boscia, Heather E, NP  metFORMIN (GLUCOPHAGE) 500 MG tablet Take 1  tablet (500 mg total) by mouth 2 (two) times daily with a meal. 06/16/18  Yes Scarboro, Audie Clear, NP  sildenafil (VIAGRA) 100 MG tablet Take 1 tablet (100 mg total) by mouth daily as needed for erectile dysfunction. 06/16/18  Yes Scarboro, Audie Clear, NP  tizanidine (ZANAFLEX) 2 MG capsule Take 2 mg by mouth 3 (three) times daily.   Yes [provider]  amoxicillin (AMOXIL) 500 MG capsule Take 1 capsule (500 mg total) by mouth 3 (three) times daily. 06/20/18   Paulette Blanch, MD  HYDROcodone-acetaminophen (NORCO) 5-325 MG tablet Take 1 tablet by mouth every 6 (six) hours as needed for moderate pain. 06/20/18   Paulette Blanch, MD  ibuprofen (ADVIL,MOTRIN) 800 MG tablet Take 1 tablet (800 mg total) by mouth every 8 (eight) hours as needed for moderate pain. 06/20/18   Paulette Blanch, MD    Allergies Patient has no known allergies.  Family History  Family history unknown: Yes    Social History Social History   Tobacco Use  . Smoking status: Current Some Day Smoker    Types: Cigarettes  . Smokeless tobacco: Never Used  Substance Use Topics  . Alcohol use: Yes    Comment: Rarely  . Drug use: No    Review of Systems  Constitutional: No fever/chills Eyes: No visual changes. ENT: Positive for dentalgia.  No sore throat. Cardiovascular: Denies chest pain. Respiratory: Denies shortness of breath. Gastrointestinal: No abdominal pain.  No nausea, no vomiting.  No diarrhea.  No constipation. Genitourinary:  Negative for dysuria. Musculoskeletal: Negative for back pain. Skin: Negative for rash. Neurological: Negative for headaches, focal weakness or numbness.   ____________________________________________   PHYSICAL EXAM:  VITAL SIGNS: ED Triage Vitals  Enc Vitals Group     BP 06/20/18 0048 (!) 162/88     Pulse Rate 06/20/18 0048 89     Resp 06/20/18 0048 17     Temp 06/20/18 0048 98.4 F (36.9 C)     Temp Source 06/20/18 0048 Oral     SpO2 06/20/18 0048 99 %     Weight 06/20/18 0052 178  lb (80.7 kg)     Height 06/20/18 0052 _0  (1.727 m)     Head Circumference --      Peak Flow --      Pain Score 06/20/18 0051 7     Pain Loc --      Pain Edu? --      Excl. in [place]? --     Constitutional: Alert and oriented. Well appearing and in no acute distress. Eyes: Conjunctivae are normal. PERRL. EOMI. Head: Atraumatic. Nose: No congestion/rhinnorhea. Mouth/Throat: Mucous membranes are moist.  Widespread dental decay.  Broken bottom right incisor and right molar without surrounding abscess or swelling. Neck: No stridor.   Cardiovascular: Normal rate, regular rhythm. Grossly normal heart sounds.  Good peripheral circulation. Respiratory: Normal respiratory effort.  No retractions. Lungs CTAB. Gastrointestinal: Soft and nontender. No distention. No abdominal bruits. No CVA tenderness. Musculoskeletal: No lower extremity tenderness nor edema.  No joint effusions. Neurologic:  Normal speech and language. No gross focal neurologic deficits are appreciated. No gait instability. Skin:  Skin is warm, dry and intact. No rash noted. Psychiatric: Mood and affect are normal. Speech and behavior are normal.  ____________________________________________   LABS (all labs ordered are listed, but only abnormal results are displayed)  Labs Reviewed - No data to display ____________________________________________  EKG  None ____________________________________________  RADIOLOGY  ED MD interpretation: None  Official radiology report(s): No results found.  ____________________________________________   PROCEDURES  Procedure(s) performed: None  Procedures  Critical Care performed: No  ____________________________________________   INITIAL IMPRESSION / ASSESSMENT AND PLAN / ED COURSE  As part of my medical decision making, I reviewed the following data within the [place] notes reviewed and incorporated, Old chart reviewed and Notes from prior  ED visits   67 year old male who presents with dentalgia secondary to broken teeth.  Widespread dental decay also noted.  Will start treatment with amoxicillin, NSAIDs, analgesia and refer to outpatient dentistry for follow-up.  Strict return precautions given.  Patient verbalizes understanding and agrees with plan of care.      ____________________________________________   FINAL CLINICAL IMPRESSION(S) / ED DIAGNOSES  Final diagnoses:  Pain, dental  Dental caries  Closed fracture of tooth, initial encounter     ED Discharge Orders        Ordered    amoxicillin (AMOXIL) 500 MG capsule  3 times daily     06/20/18 0334    ibuprofen (ADVIL,MOTRIN) 800 MG tablet  Every 8 hours PRN     06/20/18 0334    HYDROcodone-acetaminophen (NORCO) 5-325 MG tablet  Every 6 hours PRN     06/20/18 0334       Note:  This document was prepared using Dragon voice recognition software and may include unintentional dictation errors.    Paulette Blanch, MD 06/20/18 [phone]

## 2018-06-20 NOTE — ED Triage Notes (Addendum)
Pt reports toothache pain to the bottom right side of his mouth x 2 days; pt noted to have 2 broken teeth in that area; pt says he's not had pain there before and was unaware he had broken teeth; swelling noted to lower jaw line

## 2018-06-20 NOTE — Discharge Instructions (Signed)
1.  Take antibiotic as prescribed (Amoxicillin 500mg 3 times daily x7 days). 2.  You may take pain medicines as needed (Motrin/Norco #15). 3.  Return to the ER for worsening symptoms, persistent vomiting, difficulty breathing or other concerns. 

## 2018-06-21 ENCOUNTER — Encounter: Payer: Self-pay | Admitting: Adult Health

## 2018-06-22 ENCOUNTER — Other Ambulatory Visit: Payer: Self-pay | Admitting: Internal Medicine

## 2018-08-21 ENCOUNTER — Other Ambulatory Visit: Payer: Self-pay | Admitting: Adult Health

## 2018-08-21 DIAGNOSIS — E1165 Type 2 diabetes mellitus with hyperglycemia: Secondary | ICD-10-CM

## 2018-08-31 DIAGNOSIS — Z23 Encounter for immunization: Secondary | ICD-10-CM | POA: Diagnosis not present

## 2018-09-19 ENCOUNTER — Ambulatory Visit: Payer: Self-pay | Admitting: Adult Health

## 2018-10-10 ENCOUNTER — Ambulatory Visit: Payer: Self-pay | Admitting: Adult Health

## 2018-11-18 ENCOUNTER — Ambulatory Visit (INDEPENDENT_AMBULATORY_CARE_PROVIDER_SITE_OTHER): Payer: BLUE CROSS/BLUE SHIELD | Admitting: Adult Health

## 2018-11-18 ENCOUNTER — Encounter: Payer: Self-pay | Admitting: Adult Health

## 2018-11-18 VITALS — BP 115/79 | HR 77 | Resp 16 | Ht 68.0 in | Wt 174.0 lb

## 2018-11-18 DIAGNOSIS — K0889 Other specified disorders of teeth and supporting structures: Secondary | ICD-10-CM

## 2018-11-18 DIAGNOSIS — F17219 Nicotine dependence, cigarettes, with unspecified nicotine-induced disorders: Secondary | ICD-10-CM

## 2018-11-18 DIAGNOSIS — F5101 Primary insomnia: Secondary | ICD-10-CM

## 2018-11-18 DIAGNOSIS — I10 Essential (primary) hypertension: Secondary | ICD-10-CM

## 2018-11-18 DIAGNOSIS — N529 Male erectile dysfunction, unspecified: Secondary | ICD-10-CM | POA: Diagnosis not present

## 2018-11-18 DIAGNOSIS — E1165 Type 2 diabetes mellitus with hyperglycemia: Secondary | ICD-10-CM | POA: Diagnosis not present

## 2018-11-18 LAB — POCT GLYCOSYLATED HEMOGLOBIN (HGB A1C): HEMOGLOBIN A1C: 13.4 % — AB (ref 4.0–5.6)

## 2018-11-18 MED ORDER — SILDENAFIL CITRATE 100 MG PO TABS: 100.0000 mg | ORAL_TABLET | Freq: Every day | ORAL | 3 refills | Status: DC | PRN

## 2018-11-18 MED ORDER — IBUPROFEN 800 MG PO TABS: 800.0000 mg | ORAL_TABLET | Freq: Three times a day (TID) | ORAL | 0 refills | Status: DC | PRN

## 2018-11-18 MED ORDER — GLIPIZIDE 10 MG PO TABS
10.0000 mg | ORAL_TABLET | Freq: Two times a day (BID) | ORAL | 3 refills | Status: DC
Start: 2018-11-18 — End: 2019-03-03

## 2018-11-18 MED ORDER — TRAZODONE HCL 50 MG PO TABS: 25.0000 mg | ORAL_TABLET | Freq: Every evening | ORAL | 3 refills | Status: DC | PRN

## 2018-11-18 NOTE — Patient Instructions (Signed)
Diabetes Mellitus and Nutrition, Adult  When you have diabetes (diabetes mellitus), it is very important to have healthy eating habits because your blood sugar (glucose) levels are greatly affected by what you eat and drink. Eating healthy foods in the appropriate amounts, at about the same times every day, can help you:  · Control your blood glucose.  · Lower your risk of heart disease.  · Improve your blood pressure.  · Reach or maintain a healthy weight.  Every person with diabetes is different, and each person has different needs for a meal plan. Your health care provider may recommend that you work with a diet and nutrition specialist (dietitian) to make a meal plan that is best for you. Your meal plan may vary depending on factors such as:  · The calories you need.  · The medicines you take.  · Your weight.  · Your blood glucose, blood pressure, and cholesterol levels.  · Your activity level.  · Other health conditions you have, such as heart or kidney disease.  How do carbohydrates affect me?  Carbohydrates, also called carbs, affect your blood glucose level more than any other type of food. Eating carbs naturally raises the amount of glucose in your blood. Carb counting is a method for keeping track of how many carbs you eat. Counting carbs is important to keep your blood glucose at a healthy level, especially if you use insulin or take certain oral diabetes medicines.  It is important to know how many carbs you can safely have in each meal. This is different for every person. Your dietitian can help you calculate how many carbs you should have at each meal and for each snack.  Foods that contain carbs include:  · Bread, cereal, rice, pasta, and crackers.  · Potatoes and corn.  · Peas, beans, and lentils.  · Milk and yogurt.  · Fruit and juice.  · Desserts, such as cakes, cookies, ice cream, and candy.  How does alcohol affect me?  Alcohol can cause a sudden decrease in blood glucose (hypoglycemia),  especially if you use insulin or take certain oral diabetes medicines. Hypoglycemia can be a life-threatening condition. Symptoms of hypoglycemia (sleepiness, dizziness, and confusion) are similar to symptoms of having too much alcohol.  If your health care provider says that alcohol is safe for you, follow these guidelines:  · Limit alcohol intake to no more than 1 drink per day for nonpregnant women and 2 drinks per day for men. One drink equals 12 oz of beer, 5 oz of wine, or 1½ oz of hard liquor.  · Do not drink on an empty stomach.  · Keep yourself hydrated with water, diet soda, or unsweetened iced tea.  · Keep in mind that regular soda, juice, and other mixers may contain a lot of sugar and must be counted as carbs.  What are tips for following this plan?    Reading food labels  · Start by checking the serving size on the "Nutrition Facts" label of packaged foods and drinks. The amount of calories, carbs, fats, and other nutrients listed on the label is based on one serving of the item. Many items contain more than one serving per package.  · Check the total grams (g) of carbs in one serving. You can calculate the number of servings of carbs in one serving by dividing the total carbs by 15. For example, if a food has 30 g of total carbs, it would be equal to 2   servings of carbs.  · Check the number of grams (g) of saturated and trans fats in one serving. Choose foods that have low or no amount of these fats.  · Check the number of milligrams (mg) of salt (sodium) in one serving. Most people should limit total sodium intake to less than 2,300 mg per day.  · Always check the nutrition information of foods labeled as "low-fat" or "nonfat". These foods may be higher in added sugar or refined carbs and should be avoided.  · Talk to your dietitian to identify your daily goals for nutrients listed on the label.  Shopping  · Avoid buying canned, premade, or processed foods. These foods tend to be high in fat, sodium,  and added sugar.  · Shop around the outside edge of the grocery store. This includes fresh fruits and vegetables, bulk grains, fresh meats, and fresh dairy.  Cooking  · Use low-heat cooking methods, such as baking, instead of high-heat cooking methods like deep frying.  · Cook using healthy oils, such as olive, canola, or sunflower oil.  · Avoid cooking with butter, cream, or high-fat meats.  Meal planning  · Eat meals and snacks regularly, preferably at the same times every day. Avoid going long periods of time without eating.  · Eat foods high in fiber, such as fresh fruits, vegetables, beans, and whole grains. Talk to your dietitian about how many servings of carbs you can eat at each meal.  · Eat 4-6 ounces (oz) of lean protein each day, such as lean meat, chicken, fish, eggs, or tofu. One oz of lean protein is equal to:  ? 1 oz of meat, chicken, or fish.  ? 1 egg.  ? ¼ cup of tofu.  · Eat some foods each day that contain healthy fats, such as avocado, nuts, seeds, and fish.  Lifestyle  · Check your blood glucose regularly.  · Exercise regularly as told by your health care provider. This may include:  ? 150 minutes of moderate-intensity or vigorous-intensity exercise each week. This could be brisk walking, biking, or water aerobics.  ? Stretching and doing strength exercises, such as yoga or weightlifting, at least 2 times a week.  · Take medicines as told by your health care provider.  · Do not use any products that contain nicotine or tobacco, such as cigarettes and e-cigarettes. If you need help quitting, ask your health care provider.  · Work with a counselor or diabetes educator to identify strategies to manage stress and any emotional and social challenges.  Questions to ask a health care provider  · Do I need to meet with a diabetes educator?  · Do I need to meet with a dietitian?  · What number can I call if I have questions?  · When are the best times to check my blood glucose?  Where to find more  information:  · American Diabetes Association: diabetes.org  · Academy of Nutrition and Dietetics: www.eatright.org  · National Institute of Diabetes and Digestive and Kidney Diseases (NIH): www.niddk.nih.gov  Summary  · A healthy meal plan will help you control your blood glucose and maintain a healthy lifestyle.  · Working with a diet and nutrition specialist (dietitian) can help you make a meal plan that is best for you.  · Keep in mind that carbohydrates (carbs) and alcohol have immediate effects on your blood glucose levels. It is important to count carbs and to use alcohol carefully.  This information is not intended to   replace advice given to you by your health care provider. Make sure you discuss any questions you have with your health care provider.  Document Released: 07/30/2005 Document Revised: 06/02/2017 Document Reviewed: 12/07/2016  Elsevier Interactive Patient Education © 2019 [employer]

## 2018-11-18 NOTE — Progress Notes (Signed)
[facility] [place], [place] [address]  Internal MEDICINE  Office Visit Note  Patient Name: Matthew Gay  [id number]  [id number]  Date of Service: 11/20/2018  Chief Complaint  Patient presents with  . Follow-up  . Hypertension  . Diabetes    HPI  Patient is here for follow-up visit on hypertension, diabetes, and erectile dysfunction.  He reports he has been taking his medicine regularly and that he denies any issues with blood pressure.  He denies any chest pain, shortness of breath, headache, or palpitations.  His hemoglobin A1c has increased to 13.4 today.  Patient reports he has been eating a lot of sugar and he continues to drink regular sodas approximately 3 a day.  Encourage patient to stop drinking soda switch to diet sodas.  He is uncomfortable adding an injectable medication at this time and absolutely refuses.   Current Medication: Outpatient Encounter Medications as of 11/18/2018  Medication Sig  . atorvastatin (LIPITOR) 20 MG tablet Take 1 tablet(s) by mouth at bedtime for cholesterol.  . Blood Glucose Monitoring Suppl (ONE TOUCH ULTRA MINI) w/Device KIT Use as directed diagnosis e11.65  . gabapentin (NEURONTIN) 100 MG capsule TAKE ONE CAPSULE BY MOUTH TWICE A DAY FOR DIABETIC NEUROPATHY  . glucose blood test strip 1 each by Other route as needed for other. Use as instructed  . HYDROcodone-acetaminophen (NORCO) 5-325 MG tablet Take 1 tablet by mouth every 6 (six) hours as needed for moderate pain.  Marland Kitchen ibuprofen (ADVIL,MOTRIN) 800 MG tablet Take 1 tablet (800 mg total) by mouth every 8 (eight) hours as needed for moderate pain.  Marland Kitchen lisinopril-hydrochlorothiazide (PRINZIDE,ZESTORETIC) 20-12.5 MG tablet Take One PO QD for blood pressure,  . metFORMIN (GLUCOPHAGE) 500 MG tablet Take 1 tablet (500 mg total) by mouth 2 (two) times daily with a meal.  . sildenafil (VIAGRA) 100 MG tablet Take 1 tablet (100 mg total) by mouth daily as needed for erectile  dysfunction.  . tizanidine (ZANAFLEX) 2 MG capsule Take 2 mg by mouth 3 (three) times daily.  . [DISCONTINUED] ibuprofen (ADVIL,MOTRIN) 800 MG tablet Take 1 tablet (800 mg total) by mouth every 8 (eight) hours as needed for moderate pain.  . [DISCONTINUED] ibuprofen (ADVIL,MOTRIN) 800 MG tablet Take 1 tablet (800 mg total) by mouth every 8 (eight) hours as needed for moderate pain.  . [DISCONTINUED] sildenafil (VIAGRA) 100 MG tablet Take 1 tablet (100 mg total) by mouth daily as needed for erectile dysfunction.  Marland Kitchen amoxicillin (AMOXIL) 500 MG capsule Take 1 capsule (500 mg total) by mouth 3 (three) times daily. (Patient not taking: Reported on 11/18/2018)  . glipiZIDE (GLUCOTROL) 10 MG tablet Take 1 tablet (10 mg total) by mouth 2 (two) times daily before a meal.  . traZODone (DESYREL) 50 MG tablet Take 0.5-1 tablets (25-50 mg total) by mouth at bedtime as needed for sleep.   No facility-administered encounter medications on file as of 11/18/2018.     Surgical History: Past Surgical History:  Procedure Laterality Date  . colon cancer    . COLON SURGERY      Medical History: Past Medical History:  Diagnosis Date  . Diabetes mellitus without complication ([place])   . Hyperlipemia   . Hypertension     Family History: Family History  Family history unknown: Yes    Social History   Socioeconomic History  . Marital status: Married    Spouse name: Not on file  . Number of children: Not on file  .  Years of education: Not on file  . Highest education level: Not on file  Occupational History  . Not on file  Social Needs  . Financial resource strain: Not on file  . Food insecurity:    Worry: Not on file    Inability: Not on file  . Transportation needs:    Medical: Not on file    Non-medical: Not on file  Tobacco Use  . Smoking status: Current Some Day Smoker    Types: Cigarettes  . Smokeless tobacco: Never Used  Substance and Sexual Activity  . Alcohol use: Yes    Comment:  Rarely  . Drug use: No  . Sexual activity: Not on file  Lifestyle  . Physical activity:    Days per week: Not on file    Minutes per session: Not on file  . Stress: Not on file  Relationships  . Social connections:    Talks on phone: Not on file    Gets together: Not on file    Attends religious service: Not on file    Active member of club or organization: Not on file    Attends meetings of clubs or organizations: Not on file    Relationship status: Not on file  . Intimate partner violence:    Fear of current or ex partner: Not on file    Emotionally abused: Not on file    Physically abused: Not on file    Forced sexual activity: Not on file  Other Topics Concern  . Not on file  Social History Narrative  . Not on file      Review of Systems  Constitutional: Negative.  Negative for chills, fatigue and unexpected weight change.  HENT: Negative.  Negative for congestion, rhinorrhea, sneezing and sore throat.   Eyes: Negative for redness.  Respiratory: Negative.  Negative for cough, chest tightness and shortness of breath.   Cardiovascular: Negative.  Negative for chest pain and palpitations.  Gastrointestinal: Negative.  Negative for abdominal pain, constipation, diarrhea, nausea and vomiting.  Endocrine: Negative.   Genitourinary: Negative.  Negative for dysuria and frequency.  Musculoskeletal: Negative.  Negative for arthralgias, back pain, joint swelling and neck pain.  Skin: Negative.  Negative for rash.  Allergic/Immunologic: Negative.   Neurological: Negative.  Negative for tremors and numbness.  Hematological: Negative for adenopathy. Does not bruise/bleed easily.  Psychiatric/Behavioral: Negative.  Negative for behavioral problems, sleep disturbance and suicidal ideas. The patient is not nervous/anxious.     Vital Signs: BP 115/79   Pulse 77   Resp 16   Ht '5\' 8"'  (1.727 m)   Wt 174 lb (78.9 kg)   SpO2 97%   BMI 26.46 kg/m    Physical Exam Vitals signs  and nursing note reviewed.  Constitutional:      General: He is not in acute distress.    Appearance: He is well-developed. He is not diaphoretic.  HENT:     Head: Normocephalic and atraumatic.     Mouth/Throat:     Pharynx: No oropharyngeal exudate.  Eyes:     Pupils: Pupils are equal, round, and reactive to light.  Neck:     Musculoskeletal: Normal range of motion and neck supple.     Thyroid: No thyromegaly.     Vascular: No JVD.     Trachea: No tracheal deviation.  Cardiovascular:     Rate and Rhythm: Normal rate and regular rhythm.     Heart sounds: Normal heart sounds. No murmur. No friction rub.  No gallop.   Pulmonary:     Effort: Pulmonary effort is normal. No respiratory distress.     Breath sounds: Normal breath sounds. No wheezing or rales.  Chest:     Chest wall: No tenderness.  Abdominal:     Palpations: Abdomen is soft.     Tenderness: There is no abdominal tenderness. There is no guarding.  Musculoskeletal: Normal range of motion.  Lymphadenopathy:     Cervical: No cervical adenopathy.  Skin:    General: Skin is warm and dry.  Neurological:     Mental Status: He is alert and oriented to person, place, and time.     Cranial Nerves: No cranial nerve deficit.  Psychiatric:        Behavior: Behavior normal.        Thought Content: Thought content normal.        Judgment: Judgment normal.    Assessment/Plan: 1. Uncontrolled type 2 diabetes mellitus with hyperglycemia (HCC) Patient's hemoglobin A1c 13.4. Since patient is refusing to do any injectable medications we will try her on glipizide for 3 months.  He is instructed to take 1 tablet by mouth twice daily.  He verbalized understanding of this.  I do question his compliance.  We had a frank discussion about the problems uncontrolled diabetes can cause.  He verbalized understanding and reports will take the medication. - POCT HgB A1C - glipiZIDE (GLUCOTROL) 10 MG tablet; Take 1 tablet (10 mg total) by mouth 2  (two) times daily before a meal.  Dispense: 60 tablet; Refill: 3  2. Erectile dysfunction, unspecified erectile dysfunction type Patient's Viagra prescription refilled at this time. - sildenafil (VIAGRA) 100 MG tablet; Take 1 tablet (100 mg total) by mouth daily as needed for erectile dysfunction.  Dispense: 10 tablet; Refill: 3  3. Primary insomnia Patient used trazodone in the past to help him sleep.  He is requesting a refill on this medication.  Provided at this time. - traZODone (DESYREL) 50 MG tablet; Take 0.5-1 tablets (25-50 mg total) by mouth at bedtime as needed for sleep.  Dispense: 30 tablet; Refill: 3  4. Essential hypertension Blood pressure stable at this time.  Encourage patient continue taking medications as prescribed.  5. Cigarette nicotine dependence with nicotine-induced disorder Once again encourage smoking cessation. Smoking cessation counseling: 1. Pt acknowledges the risks of long term smoking, she will try to quite smoking. 2. Options for different medications including nicotine products, chewing gum, patch etc, Wellbutrin and Chantix is discussed 3. Goal and date of compete cessation is discussed 4. Total time spent in smoking cessation is 15 min.  6. Tooth pain Patient reports intermittent jaw and tooth pain where he had a tooth removed a while back.  He is requesting some 800 mg of Profen.  He states that he typically uses 1 of these Motrins every week or so when the pain flares up. - ibuprofen (ADVIL,MOTRIN) 800 MG tablet; Take 1 tablet (800 mg total) by mouth every 8 (eight) hours as needed for moderate pain.  Dispense: 30 tablet; Refill: 0  General Counseling: Nina verbalizes understanding of the findings of todays visit and agrees with plan of treatment. I have discussed any further diagnostic evaluation that may be needed or ordered today. We also reviewed his medications today. he has been encouraged to call the office with any questions or concerns that  should arise related to todays visit.    Orders Placed This Encounter  Procedures  . POCT HgB A1C  Meds ordered this encounter  Medications  . traZODone (DESYREL) 50 MG tablet    Sig: Take 0.5-1 tablets (25-50 mg total) by mouth at bedtime as needed for sleep.    Dispense:  30 tablet    Refill:  3  . sildenafil (VIAGRA) 100 MG tablet    Sig: Take 1 tablet (100 mg total) by mouth daily as needed for erectile dysfunction.    Dispense:  10 tablet    Refill:  3  . DISCONTD: ibuprofen (ADVIL,MOTRIN) 800 MG tablet    Sig: Take 1 tablet (800 mg total) by mouth every 8 (eight) hours as needed for moderate pain.    Dispense:  30 tablet    Refill:  0  . ibuprofen (ADVIL,MOTRIN) 800 MG tablet    Sig: Take 1 tablet (800 mg total) by mouth every 8 (eight) hours as needed for moderate pain.    Dispense:  30 tablet    Refill:  0  . glipiZIDE (GLUCOTROL) 10 MG tablet    Sig: Take 1 tablet (10 mg total) by mouth 2 (two) times daily before a meal.    Dispense:  60 tablet    Refill:  3    Time spent: 25 Minutes   This patient was seen by Orson Gear AGNP-C in Collaboration with Dr Lavera Guise as a part of collaborative care agreement     Kendell Bane AGNP-C Internal medicine

## 2018-11-22 ENCOUNTER — Other Ambulatory Visit: Payer: Self-pay

## 2018-11-22 DIAGNOSIS — E1165 Type 2 diabetes mellitus with hyperglycemia: Secondary | ICD-10-CM

## 2018-11-22 MED ORDER — METFORMIN HCL 500 MG PO TABS: 500.0000 mg | ORAL_TABLET | Freq: Two times a day (BID) | ORAL | 1 refills | Status: DC

## 2018-12-15 ENCOUNTER — Other Ambulatory Visit: Payer: Self-pay

## 2018-12-15 DIAGNOSIS — E1165 Type 2 diabetes mellitus with hyperglycemia: Secondary | ICD-10-CM

## 2018-12-15 MED ORDER — METFORMIN HCL 500 MG PO TABS: 500.0000 mg | ORAL_TABLET | Freq: Two times a day (BID) | ORAL | 1 refills | Status: DC

## 2018-12-22 ENCOUNTER — Other Ambulatory Visit: Payer: Self-pay

## 2018-12-22 MED ORDER — LISINOPRIL-HYDROCHLOROTHIAZIDE 20-12.5 MG PO TABS: ORAL_TABLET | ORAL | 1 refills | Status: DC

## 2018-12-22 MED ORDER — ATORVASTATIN CALCIUM 20 MG PO TABS: ORAL_TABLET | ORAL | 1 refills | Status: DC

## 2019-01-12 ENCOUNTER — Telehealth: Payer: Self-pay | Admitting: Adult Health

## 2019-01-12 NOTE — Telephone Encounter (Signed)
Called and left message changing his appointment from 02/17/2019 to 02/16/2019 because of Adam's chart times.jw

## 2019-01-30 ENCOUNTER — Other Ambulatory Visit: Payer: Self-pay

## 2019-01-30 DIAGNOSIS — F5101 Primary insomnia: Secondary | ICD-10-CM

## 2019-01-30 MED ORDER — TRAZODONE HCL 50 MG PO TABS: 25.0000 mg | ORAL_TABLET | Freq: Every evening | ORAL | 1 refills | Status: DC | PRN

## 2019-02-16 ENCOUNTER — Ambulatory Visit: Payer: BLUE CROSS/BLUE SHIELD | Admitting: Nurse Practitioner

## 2019-02-17 ENCOUNTER — Ambulatory Visit: Payer: BLUE CROSS/BLUE SHIELD | Admitting: Adult Health

## 2019-03-03 ENCOUNTER — Encounter: Payer: Self-pay | Admitting: Nurse Practitioner

## 2019-03-03 ENCOUNTER — Ambulatory Visit: Payer: BLUE CROSS/BLUE SHIELD | Admitting: Nurse Practitioner

## 2019-03-03 ENCOUNTER — Other Ambulatory Visit: Payer: Self-pay

## 2019-03-03 VITALS — BP 140/82 | HR 69 | Resp 16 | Ht 68.0 in | Wt 175.0 lb

## 2019-03-03 DIAGNOSIS — M064 Inflammatory polyarthropathy: Secondary | ICD-10-CM

## 2019-03-03 DIAGNOSIS — N529 Male erectile dysfunction, unspecified: Secondary | ICD-10-CM | POA: Diagnosis not present

## 2019-03-03 DIAGNOSIS — F5101 Primary insomnia: Secondary | ICD-10-CM

## 2019-03-03 DIAGNOSIS — E1165 Type 2 diabetes mellitus with hyperglycemia: Secondary | ICD-10-CM | POA: Diagnosis not present

## 2019-03-03 DIAGNOSIS — I10 Essential (primary) hypertension: Secondary | ICD-10-CM

## 2019-03-03 LAB — POCT GLYCOSYLATED HEMOGLOBIN (HGB A1C): Hemoglobin A1C: 9 % — AB (ref 4.0–5.6)

## 2019-03-03 MED ORDER — TRAZODONE HCL 50 MG PO TABS: 25.0000 mg | ORAL_TABLET | Freq: Every evening | ORAL | 3 refills | Status: DC | PRN

## 2019-03-03 MED ORDER — SILDENAFIL CITRATE 100 MG PO TABS: 100.0000 mg | ORAL_TABLET | Freq: Every day | ORAL | 3 refills | Status: DC | PRN

## 2019-03-03 MED ORDER — METFORMIN HCL 850 MG PO TABS: 850.0000 mg | ORAL_TABLET | Freq: Two times a day (BID) | ORAL | 3 refills | Status: DC

## 2019-03-03 MED ORDER — ATORVASTATIN CALCIUM 20 MG PO TABS: ORAL_TABLET | ORAL | 1 refills | Status: DC

## 2019-03-03 MED ORDER — GLIPIZIDE 10 MG PO TABS: 10.0000 mg | ORAL_TABLET | Freq: Two times a day (BID) | ORAL | 3 refills | Status: DC

## 2019-03-03 MED ORDER — GLUCOSE BLOOD VI STRP: 1.0000 | ORAL_STRIP | Freq: Every day | 3 refills | Status: DC

## 2019-03-03 MED ORDER — LISINOPRIL-HYDROCHLOROTHIAZIDE 20-12.5 MG PO TABS: ORAL_TABLET | ORAL | 1 refills | Status: DC

## 2019-03-03 MED ORDER — IBUPROFEN 800 MG PO TABS: 800.0000 mg | ORAL_TABLET | Freq: Three times a day (TID) | ORAL | 2 refills | Status: DC | PRN

## 2019-03-03 NOTE — Progress Notes (Signed)
[facility] [place], [place] [address]  Internal MEDICINE  Office Visit Note  Patient Name: Matthew Gay  [id number]  [id number]  Date of Service: 03/04/2019  Chief Complaint  Patient presents with  . Diabetes  . Hyperlipidemia  . Hypertension  . Quality Metric Gaps    foot exam ,colonoscopy and pneumonia shot    The patient is here for routine follow up exam. He is diabetic. He is taking metformin 550m twice daiy and is taking glipizide 171mtwice daily to control his blood sugar.  His HgbA1c is 9.0 today, down from 13.4 at his most recent visit. Blood pressure is well controlled. He needs to have refills of routine medication. Will schedule him for cpe at next visit. Will need to have diabetic foot exam at that time.       Current Medication: Outpatient Encounter Medications as of 03/03/2019  Medication Sig  . atorvastatin (LIPITOR) 20 MG tablet Take 1 tablet(s) by mouth at bedtime for cholesterol.  . Blood Glucose Monitoring Suppl (ONE TOUCH ULTRA MINI) w/Device KIT Use as directed diagnosis e11.65  . gabapentin (NEURONTIN) 100 MG capsule TAKE ONE CAPSULE BY MOUTH TWICE A DAY FOR DIABETIC NEUROPATHY  . glipiZIDE (GLUCOTROL) 10 MG tablet Take 1 tablet (10 mg total) by mouth 2 (two) times daily before a meal.  . glucose blood test strip 1 each by Other route daily. Check blood sugars once daily and as needed.  E11.65  . HYDROcodone-acetaminophen (NORCO) 5-325 MG tablet Take 1 tablet by mouth every 6 (six) hours as needed for moderate pain.  . Marland Kitchenbuprofen (ADVIL) 800 MG tablet Take 1 tablet (800 mg total) by mouth every 8 (eight) hours as needed for moderate pain.  . Marland Kitchenisinopril-hydrochlorothiazide (ZESTORETIC) 20-12.5 MG tablet Take One PO QD for blood pressure,  . metFORMIN (GLUCOPHAGE) 850 MG tablet Take 1 tablet (850 mg total) by mouth 2 (two) times daily with a meal.  . sildenafil (VIAGRA) 100 MG tablet Take 1 tablet (100 mg total) by mouth daily as  needed for erectile dysfunction.  . tizanidine (ZANAFLEX) 2 MG capsule Take 2 mg by mouth 3 (three) times daily.  . traZODone (DESYREL) 50 MG tablet Take 0.5-1 tablets (25-50 mg total) by mouth at bedtime as needed for sleep.  . [DISCONTINUED] atorvastatin (LIPITOR) 20 MG tablet Take 1 tablet(s) by mouth at bedtime for cholesterol.  . [DISCONTINUED] glipiZIDE (GLUCOTROL) 10 MG tablet Take 1 tablet (10 mg total) by mouth 2 (two) times daily before a meal.  . [DISCONTINUED] glucose blood test strip 1 each by Other route as needed for other. Use as instructed  . [DISCONTINUED] ibuprofen (ADVIL,MOTRIN) 800 MG tablet Take 1 tablet (800 mg total) by mouth every 8 (eight) hours as needed for moderate pain.  . [DISCONTINUED] lisinopril-hydrochlorothiazide (PRINZIDE,ZESTORETIC) 20-12.5 MG tablet Take One PO QD for blood pressure,  . [DISCONTINUED] metFORMIN (GLUCOPHAGE) 500 MG tablet Take 1 tablet (500 mg total) by mouth 2 (two) times daily with a meal.  . [DISCONTINUED] sildenafil (VIAGRA) 100 MG tablet Take 1 tablet (100 mg total) by mouth daily as needed for erectile dysfunction.  . [DISCONTINUED] traZODone (DESYREL) 50 MG tablet Take 0.5-1 tablets (25-50 mg total) by mouth at bedtime as needed for sleep.  . [DISCONTINUED] amoxicillin (AMOXIL) 500 MG capsule Take 1 capsule (500 mg total) by mouth 3 (three) times daily. (Patient not taking: Reported on 11/18/2018)   No facility-administered encounter medications on file as of 03/03/2019.  Surgical History: Past Surgical History:  Procedure Laterality Date  . colon cancer    . COLON SURGERY      Medical History: Past Medical History:  Diagnosis Date  . Diabetes mellitus without complication ([place])   . Hyperlipemia   . Hypertension     Family History: Family History  Family history unknown: Yes    Social History   Socioeconomic History  . Marital status: Married    Spouse name: Not on file  . Number of children: Not on file  . Years  of education: Not on file  . Highest education level: Not on file  Occupational History  . Not on file  Social Needs  . Financial resource strain: Not on file  . Food insecurity:    Worry: Not on file    Inability: Not on file  . Transportation needs:    Medical: Not on file    Non-medical: Not on file  Tobacco Use  . Smoking status: Current Some Day Smoker    Types: Cigarettes  . Smokeless tobacco: Never Used  Substance and Sexual Activity  . Alcohol use: Yes    Comment: Rarely  . Drug use: No  . Sexual activity: Not on file  Lifestyle  . Physical activity:    Days per week: Not on file    Minutes per session: Not on file  . Stress: Not on file  Relationships  . Social connections:    Talks on phone: Not on file    Gets together: Not on file    Attends religious service: Not on file    Active member of club or organization: Not on file    Attends meetings of clubs or organizations: Not on file    Relationship status: Not on file  . Intimate partner violence:    Fear of current or ex partner: Not on file    Emotionally abused: Not on file    Physically abused: Not on file    Forced sexual activity: Not on file  Other Topics Concern  . Not on file  Social History Narrative  . Not on file      Review of Systems  Constitutional: Negative for chills, fatigue and unexpected weight change.  HENT: Negative for congestion, rhinorrhea, sneezing and sore throat.   Respiratory: Negative for cough, chest tightness, shortness of breath and wheezing.   Cardiovascular: Negative for chest pain and palpitations.  Gastrointestinal: Negative.  Negative for abdominal pain, constipation, diarrhea, nausea and vomiting.  Endocrine: Negative for heat intolerance, polydipsia and polyuria.       Blood sugars are improving.   Musculoskeletal: Negative for arthralgias, back pain, joint swelling and neck pain.  Skin: Negative for rash.  Allergic/Immunologic: Negative for environmental  allergies.  Neurological: Negative for dizziness, tremors, numbness and headaches.  Hematological: Negative for adenopathy. Does not bruise/bleed easily.  Psychiatric/Behavioral: Negative for behavioral problems, sleep disturbance and suicidal ideas. The patient is not nervous/anxious.    Today's Vitals   03/03/19 1516  BP: 140/82  Pulse: 69  Resp: 16  SpO2: 99%  Weight: 175 lb (79.4 kg)  Height: '5\' 8"'  (1.727 m)   Body mass index is 26.61 kg/m.  Physical Exam Vitals signs and nursing note reviewed.  Constitutional:      General: He is not in acute distress.    Appearance: Normal appearance. He is well-developed. He is not diaphoretic.  HENT:     Head: Normocephalic and atraumatic.     Mouth/Throat:  Pharynx: No oropharyngeal exudate.  Eyes:     Pupils: Pupils are equal, round, and reactive to light.  Neck:     Musculoskeletal: Normal range of motion and neck supple.     Thyroid: No thyromegaly.     Vascular: No carotid bruit or JVD.     Trachea: No tracheal deviation.  Cardiovascular:     Rate and Rhythm: Normal rate and regular rhythm.     Heart sounds: Normal heart sounds. No murmur. No friction rub. No gallop.   Pulmonary:     Effort: Pulmonary effort is normal. No respiratory distress.     Breath sounds: Normal breath sounds. No wheezing or rales.  Chest:     Chest wall: No tenderness.  Abdominal:     General: Bowel sounds are normal.     Palpations: Abdomen is soft.     Tenderness: There is no abdominal tenderness.  Musculoskeletal: Normal range of motion.  Lymphadenopathy:     Cervical: No cervical adenopathy.  Skin:    General: Skin is warm and dry.  Neurological:     Mental Status: He is alert and oriented to person, place, and time.     Cranial Nerves: No cranial nerve deficit.  Psychiatric:        Behavior: Behavior normal.        Thought Content: Thought content normal.        Judgment: Judgment normal.   Assessment/Plan: 1. Uncontrolled type 2  diabetes mellitus with hyperglycemia (HCC) - POCT HgB A1C 9.0 today. Increase metformin to 816m twice daily. Continue other diabetic medication as prescribed. Refills provided today.  - glipiZIDE (GLUCOTROL) 10 MG tablet; Take 1 tablet (10 mg total) by mouth 2 (two) times daily before a meal.  Dispense: 60 tablet; Refill: 3 - metFORMIN (GLUCOPHAGE) 850 MG tablet; Take 1 tablet (850 mg total) by mouth 2 (two) times daily with a meal.  Dispense: 60 tablet; Refill: 3 - glucose blood test strip; 1 each by Other route daily. Check blood sugars once daily and as needed.  E11.65  Dispense: 100 each; Refill: 3  2. Essential hypertension Stable. Continue bp medication as prescribed  - lisinopril-hydrochlorothiazide (ZESTORETIC) 20-12.5 MG tablet; Take One PO QD for blood pressure,  Dispense: 90 tablet; Refill: 1  3. Primary insomnia Patient should continue to trazodone 570m one to two tablets at night as needed for insomnia.  - traZODone (DESYREL) 50 MG tablet; Take 0.5-1 tablets (25-50 mg total) by mouth at bedtime as needed for sleep.  Dispense: 60 tablet; Refill: 3  4. Erectile dysfunction, unspecified erectile dysfunction type May take sildenafil 10044ms needed.  - sildenafil (VIAGRA) 100 MG tablet; Take 1 tablet (100 mg total) by mouth daily as needed for erectile dysfunction.  Dispense: 10 tablet; Refill: 3  5. Inflammatory polyarthropathy (HCCBethelay take ibuprofen 800m95m to three times daily as needed for pain/inflammation.  - ibuprofen (ADVIL) 800 MG tablet; Take 1 tablet (800 mg total) by mouth every 8 (eight) hours as needed for moderate pain.  Dispense: 30 tablet; Refill: 2  General Counseling: Cooper verbalizes understanding of the findings of todays visit and agrees with plan of treatment. I have discussed any further diagnostic evaluation that may be needed or ordered today. We also reviewed his medications today. he has been encouraged to call the office with any questions or concerns  that should arise related to todays visit.  Diabetes Counseling:  1. Addition of ACE inh/ ARB'S for nephroprotection. Microalbumin is updated  2. Diabetic foot care, prevention of complications. Podiatry consult 3. Exercise and lose weight.  4. Diabetic eye examination, Diabetic eye exam is updated  5. Monitor blood sugar closlely. nutrition counseling.  6. Sign and symptoms of hypoglycemia including shaking sweating,confusion and headaches.  This patient was seen by Leretha Pol FNP Collaboration with Dr Lavera Guise as a part of collaborative care agreement  Orders Placed This Encounter  Procedures  . POCT HgB A1C    Meds ordered this encounter  Medications  . atorvastatin (LIPITOR) 20 MG tablet    Sig: Take 1 tablet(s) by mouth at bedtime for cholesterol.    Dispense:  90 tablet    Refill:  1    Order Specific Question:   Supervising Provider    Answer:   Lavera Guise [[id number]]  . glipiZIDE (GLUCOTROL) 10 MG tablet    Sig: Take 1 tablet (10 mg total) by mouth 2 (two) times daily before a meal.    Dispense:  60 tablet    Refill:  3    Order Specific Question:   Supervising Provider    Answer:   Lavera Guise [place]  . ibuprofen (ADVIL) 800 MG tablet    Sig: Take 1 tablet (800 mg total) by mouth every 8 (eight) hours as needed for moderate pain.    Dispense:  30 tablet    Refill:  2    Order Specific Question:   Supervising Provider    Answer:   Lavera Guise [[id number]]  . lisinopril-hydrochlorothiazide (ZESTORETIC) 20-12.5 MG tablet    Sig: Take One PO QD for blood pressure,    Dispense:  90 tablet    Refill:  1    Order Specific Question:   Supervising Provider    Answer:   Lavera Guise [[id number]]  . metFORMIN (GLUCOPHAGE) 850 MG tablet    Sig: Take 1 tablet (850 mg total) by mouth 2 (two) times daily with a meal.    Dispense:  60 tablet    Refill:  3    Order Specific Question:   Supervising Provider    Answer:   Lavera Guise [place]  . sildenafil (VIAGRA) 100 MG tablet     Sig: Take 1 tablet (100 mg total) by mouth daily as needed for erectile dysfunction.    Dispense:  10 tablet    Refill:  3    Order Specific Question:   Supervising Provider    Answer:   Lavera Guise [[id number]]  . traZODone (DESYREL) 50 MG tablet    Sig: Take 0.5-1 tablets (25-50 mg total) by mouth at bedtime as needed for sleep.    Dispense:  60 tablet    Refill:  3    Order Specific Question:   Supervising Provider    Answer:   Lavera Guise [[id number]]  . glucose blood test strip    Sig: 1 each by Other route daily. Check blood sugars once daily and as needed.  E11.65    Dispense:  100 each    Refill:  3    Order Specific Question:   Supervising Provider    Answer:   Lavera Guise [[id number]]    Time spent: 6 Minutes      Dr Lavera Guise Internal medicine

## 2019-03-04 DIAGNOSIS — K0889 Other specified disorders of teeth and supporting structures: Secondary | ICD-10-CM | POA: Insufficient documentation

## 2019-03-04 DIAGNOSIS — N529 Male erectile dysfunction, unspecified: Secondary | ICD-10-CM | POA: Insufficient documentation

## 2019-03-08 ENCOUNTER — Encounter: Payer: Self-pay | Admitting: Adult Health

## 2019-05-18 ENCOUNTER — Other Ambulatory Visit: Payer: Self-pay

## 2019-05-18 DIAGNOSIS — E1165 Type 2 diabetes mellitus with hyperglycemia: Secondary | ICD-10-CM

## 2019-05-18 MED ORDER — METFORMIN HCL 850 MG PO TABS: 850.0000 mg | ORAL_TABLET | Freq: Two times a day (BID) | ORAL | 3 refills | Status: DC

## 2019-05-24 ENCOUNTER — Other Ambulatory Visit: Payer: Self-pay

## 2019-05-24 DIAGNOSIS — E1165 Type 2 diabetes mellitus with hyperglycemia: Secondary | ICD-10-CM

## 2019-05-24 MED ORDER — METFORMIN HCL 850 MG PO TABS: 850.0000 mg | ORAL_TABLET | Freq: Two times a day (BID) | ORAL | 1 refills | Status: DC

## 2019-06-16 ENCOUNTER — Encounter: Payer: BLUE CROSS/BLUE SHIELD | Admitting: Nurse Practitioner

## 2019-07-31 ENCOUNTER — Ambulatory Visit: Payer: BLUE CROSS/BLUE SHIELD | Admitting: Nurse Practitioner

## 2019-08-30 ENCOUNTER — Telehealth: Payer: Self-pay

## 2019-08-30 NOTE — Telephone Encounter (Signed)
I returned patient phone call, unable to leave a message, pt needs appt and needs to callback. Matthew Gay

## 2019-09-07 ENCOUNTER — Other Ambulatory Visit: Payer: Self-pay

## 2019-09-07 DIAGNOSIS — F5101 Primary insomnia: Secondary | ICD-10-CM

## 2019-09-07 MED ORDER — TRAZODONE HCL 50 MG PO TABS: 25.0000 mg | ORAL_TABLET | Freq: Every evening | ORAL | 0 refills | Status: DC | PRN

## 2019-09-07 NOTE — Telephone Encounter (Signed)
Try to call pt need appt for further refills phone is not right mailed letter to pt

## 2019-09-27 ENCOUNTER — Telehealth: Payer: Self-pay

## 2019-09-27 NOTE — Telephone Encounter (Signed)
Screened and confirmed 09-29-19 appointment. °

## 2019-09-29 ENCOUNTER — Ambulatory Visit: Payer: BLUE CROSS/BLUE SHIELD | Admitting: Nurse Practitioner

## 2019-09-29 ENCOUNTER — Other Ambulatory Visit: Payer: Self-pay

## 2019-09-29 ENCOUNTER — Encounter: Payer: Self-pay | Admitting: Nurse Practitioner

## 2019-09-29 VITALS — BP 154/85 | HR 68 | Temp 97.7°F | Resp 16 | Ht 68.0 in | Wt 176.0 lb

## 2019-09-29 DIAGNOSIS — E1165 Type 2 diabetes mellitus with hyperglycemia: Secondary | ICD-10-CM | POA: Diagnosis not present

## 2019-09-29 DIAGNOSIS — Z1159 Encounter for screening for other viral diseases: Secondary | ICD-10-CM | POA: Diagnosis not present

## 2019-09-29 DIAGNOSIS — Z23 Encounter for immunization: Secondary | ICD-10-CM

## 2019-09-29 DIAGNOSIS — I10 Essential (primary) hypertension: Secondary | ICD-10-CM | POA: Diagnosis not present

## 2019-09-29 DIAGNOSIS — N529 Male erectile dysfunction, unspecified: Secondary | ICD-10-CM

## 2019-09-29 DIAGNOSIS — Z1211 Encounter for screening for malignant neoplasm of colon: Secondary | ICD-10-CM

## 2019-09-29 LAB — POCT GLYCOSYLATED HEMOGLOBIN (HGB A1C): Hemoglobin A1C: 8.9 % — AB (ref 4.0–5.6)

## 2019-09-29 MED ORDER — PNEUMOCOCCAL 13-VAL CONJ VACC IM SUSP: 0.5000 mL | Freq: Once | INTRAMUSCULAR | 0 refills | Status: AC

## 2019-09-29 MED ORDER — SILDENAFIL CITRATE 100 MG PO TABS: 100.0000 mg | ORAL_TABLET | Freq: Every day | ORAL | 3 refills | Status: DC | PRN

## 2019-09-29 NOTE — Progress Notes (Signed)
[facility] [place], [place] [address]  Internal MEDICINE  Office Visit Note  Patient Name: Matthew Gay  [id number]  [id number]  Date of Service: 10/14/2019  Chief Complaint  Patient presents with  . Diabetes  . Hypertension  . Hyperlipidemia  . Medication Refill    slidenafil   . Quality Metric Gaps    diabetic foot exam, pna vacc, colonoscopy     The patient is here for routine follow up. Blood sugars are running high. States that since beginning of pandemic, he has been working 12 hour shifts and is forgetting to take his medication for diabetes and for hypertension. His HgbA1c is 8.9 today, which is actually the best reading we have had in a while. Blood pressure is elevated. He has not taken his blood pressure medication in at lest two days. He is due to have AWV. Also needs to have routine, fasting labs. Also needs to have consultation for colon cancer screening. The patient states that he feels well. Denies chest pain, chest pressure, or shortness of breath. The patient does have history of colon cancer. Diagnosed in 2008. Has not had follow up for this in some time.       Current Medication: Outpatient Encounter Medications as of 09/29/2019  Medication Sig  . atorvastatin (LIPITOR) 20 MG tablet Take 1 tablet(s) by mouth at bedtime for cholesterol.  . Blood Glucose Monitoring Suppl (ONE TOUCH ULTRA MINI) w/Device KIT Use as directed diagnosis e11.65  . gabapentin (NEURONTIN) 100 MG capsule TAKE ONE CAPSULE BY MOUTH TWICE A DAY FOR DIABETIC NEUROPATHY  . glipiZIDE (GLUCOTROL) 10 MG tablet Take 1 tablet (10 mg total) by mouth 2 (two) times daily before a meal.  . glucose blood test strip 1 each by Other route daily. Check blood sugars once daily and as needed.  E11.65  . HYDROcodone-acetaminophen (NORCO) 5-325 MG tablet Take 1 tablet by mouth every 6 (six) hours as needed for moderate pain.  Marland Kitchen ibuprofen (ADVIL) 800 MG tablet Take 1 tablet (800 mg  total) by mouth every 8 (eight) hours as needed for moderate pain.  Marland Kitchen lisinopril-hydrochlorothiazide (ZESTORETIC) 20-12.5 MG tablet Take One PO QD for blood pressure,  . metFORMIN (GLUCOPHAGE) 850 MG tablet Take 1 tablet (850 mg total) by mouth 2 (two) times daily with a meal.  . sildenafil (VIAGRA) 100 MG tablet Take 1 tablet (100 mg total) by mouth daily as needed for erectile dysfunction.  . tizanidine (ZANAFLEX) 2 MG capsule Take 2 mg by mouth 3 (three) times daily.  . traZODone (DESYREL) 50 MG tablet Take 0.5-1 tablets (25-50 mg total) by mouth at bedtime as needed for sleep.  . [DISCONTINUED] sildenafil (VIAGRA) 100 MG tablet Take 1 tablet (100 mg total) by mouth daily as needed for erectile dysfunction.  . [EXPIRED] pneumococcal 13-valent conjugate vaccine (PREVNAR 13) SUSP injection Inject 0.5 mLs into the muscle once for 1 dose.   No facility-administered encounter medications on file as of 09/29/2019.     Surgical History: Past Surgical History:  Procedure Laterality Date  . colon cancer    . COLON SURGERY      Medical History: Past Medical History:  Diagnosis Date  . Diabetes mellitus without complication ([place])   . Hyperlipemia   . Hypertension     Family History: Family History  Family history unknown: Yes    Social History   Socioeconomic History  . Marital status: Married    Spouse name: Not on file  .  Number of children: Not on file  . Years of education: Not on file  . Highest education level: Not on file  Occupational History  . Not on file  Social Needs  . Financial resource strain: Not on file  . Food insecurity    Worry: Not on file    Inability: Not on file  . Transportation needs    Medical: Not on file    Non-medical: Not on file  Tobacco Use  . Smoking status: Current Some Day Smoker    Types: Cigarettes  . Smokeless tobacco: Never Used  Substance and Sexual Activity  . Alcohol use: Yes    Comment: Rarely  . Drug use: No  . Sexual  activity: Not on file  Lifestyle  . Physical activity    Days per week: Not on file    Minutes per session: Not on file  . Stress: Not on file  Relationships  . Social Herbalist on phone: Not on file    Gets together: Not on file    Attends religious service: Not on file    Active member of club or organization: Not on file    Attends meetings of clubs or organizations: Not on file    Relationship status: Not on file  . Intimate partner violence    Fear of current or ex partner: Not on file    Emotionally abused: Not on file    Physically abused: Not on file    Forced sexual activity: Not on file  Other Topics Concern  . Not on file  Social History Narrative  . Not on file      Review of Systems  Constitutional: Negative for chills, fatigue and unexpected weight change.  HENT: Negative for congestion, rhinorrhea, sneezing and sore throat.   Respiratory: Negative for cough, chest tightness, shortness of breath and wheezing.   Cardiovascular: Negative for chest pain and palpitations.       Blood pressure is mildly elevated today.   Gastrointestinal: Negative for abdominal pain, constipation, diarrhea, nausea and vomiting.  Endocrine: Negative for cold intolerance, heat intolerance, polydipsia and polyuria.       Blood sugars are improving.   Musculoskeletal: Negative for arthralgias, back pain, joint swelling and neck pain.  Skin: Negative for rash.  Allergic/Immunologic: Negative for environmental allergies.  Neurological: Negative for dizziness, tremors, numbness and headaches.  Hematological: Negative for adenopathy. Does not bruise/bleed easily.  Psychiatric/Behavioral: Negative for behavioral problems, sleep disturbance and suicidal ideas. The patient is not nervous/anxious.     Today's Vitals   09/29/19 0949  BP: (!) 154/85  Pulse: 68  Resp: 16  Temp: 97.7 F (36.5 C)  SpO2: 100%  Weight: 176 lb (79.8 kg)  Height: 5' 8" (1.727 m)   Body mass index  is 26.76 kg/m.  Physical Exam Vitals signs and nursing note reviewed.  Constitutional:      General: He is not in acute distress.    Appearance: Normal appearance. He is well-developed. He is not diaphoretic.  HENT:     Head: Normocephalic and atraumatic.     Mouth/Throat:     Pharynx: No oropharyngeal exudate.  Eyes:     Extraocular Movements: Extraocular movements intact.     Pupils: Pupils are equal, round, and reactive to light.  Neck:     Musculoskeletal: Normal range of motion and neck supple.     Thyroid: No thyromegaly.     Vascular: No carotid bruit or JVD.  Trachea: No tracheal deviation.  Cardiovascular:     Rate and Rhythm: Normal rate and regular rhythm.     Heart sounds: Normal heart sounds. No murmur. No friction rub. No gallop.   Pulmonary:     Effort: Pulmonary effort is normal. No respiratory distress.     Breath sounds: Normal breath sounds. No wheezing or rales.  Chest:     Chest wall: No tenderness.  Abdominal:     Palpations: Abdomen is soft.  Musculoskeletal: Normal range of motion.  Lymphadenopathy:     Cervical: No cervical adenopathy.  Skin:    General: Skin is warm and dry.  Neurological:     Mental Status: He is alert and oriented to person, place, and time.     Cranial Nerves: No cranial nerve deficit.  Psychiatric:        Mood and Affect: Mood normal.        Behavior: Behavior normal.        Thought Content: Thought content normal.        Judgment: Judgment normal.    Assessment/Plan: 1. Type 2 diabetes mellitus with hyperglycemia, without long-term current use of insulin (HCC) - POCT HgB A1C 8.9 today. Patient should continue diabetic medications as prescribed. Suggested he set alarm on his phone to remind him to take medications as prescribed. Reviewed importance of following a low carbohydrate, low sugar diet, and incorporate exercise into daily routine.   2. Essential hypertension Generally stable. Continue bp medication as  prescribed.   3. Erectile dysfunction, unspecified erectile dysfunction type May take viagra as needed and as prescribed  - sildenafil (VIAGRA) 100 MG tablet; Take 1 tablet (100 mg total) by mouth daily as needed for erectile dysfunction.  Dispense: 10 tablet; Refill: 3  4. Encounter for hepatitis C screening test for low risk patient Screening for hepatitis C with routine, fasting labs.   5. Need for vaccination against Streptococcus pneumoniae using pneumococcal conjugate vaccine 13 Prescription for prevnar 13 sent to pharmacy for administration.  - pneumococcal 13-valent conjugate vaccine (PREVNAR 13) SUSP injection; Inject 0.5 mLs into the muscle once for 1 dose.  Dispense: 0.5 mL; Refill: 0  6. Screening for colon cancer Refer to GI for screening for colonoscopy.  - Ambulatory referral to Gastroenterology  General Counseling: fisher hargadon understanding of the findings of todays visit and agrees with plan of treatment. I have discussed any further diagnostic evaluation that may be needed or ordered today. We also reviewed his medications today. he has been encouraged to call the office with any questions or concerns that should arise related to todays visit.  Diabetes Counseling:  1. Addition of ACE inh/ ARB'S for nephroprotection. Microalbumin is updated  2. Diabetic foot care, prevention of complications. Podiatry consult 3. Exercise and lose weight.  4. Diabetic eye examination, Diabetic eye exam is updated  5. Monitor blood sugar closlely. nutrition counseling.  6. Sign and symptoms of hypoglycemia including shaking sweating,confusion and headaches.  This patient was seen by Leretha Pol FNP Collaboration with Dr Lavera Guise as a part of collaborative care agreement  Orders Placed This Encounter  Procedures  . Ambulatory referral to Gastroenterology  . POCT HgB A1C    Meds ordered this encounter  Medications  . sildenafil (VIAGRA) 100 MG tablet    Sig: Take 1  tablet (100 mg total) by mouth daily as needed for erectile dysfunction.    Dispense:  10 tablet    Refill:  3    Order  Specific Question:   Supervising Provider    Answer:   Lavera Guise [[id number]]  . pneumococcal 13-valent conjugate vaccine (PREVNAR 13) SUSP injection    Sig: Inject 0.5 mLs into the muscle once for 1 dose.    Dispense:  0.5 mL    Refill:  0    Order Specific Question:   Supervising Provider    Answer:   Lavera Guise [[id number]]    Time spent: 30 Minutes      Dr Lavera Guise Internal medicine

## 2019-10-04 ENCOUNTER — Telehealth: Payer: Self-pay | Admitting: Gastroenterology

## 2019-10-04 NOTE — Telephone Encounter (Signed)
Returned patients call, however unable to contact due to voicemail not being set up.  Thanks [employer]

## 2019-10-04 NOTE — Telephone Encounter (Signed)
Pt left vm returning your call 

## 2019-10-14 DIAGNOSIS — E1165 Type 2 diabetes mellitus with hyperglycemia: Secondary | ICD-10-CM | POA: Insufficient documentation

## 2019-10-14 DIAGNOSIS — Z23 Encounter for immunization: Secondary | ICD-10-CM | POA: Insufficient documentation

## 2019-10-14 DIAGNOSIS — Z1211 Encounter for screening for malignant neoplasm of colon: Secondary | ICD-10-CM | POA: Insufficient documentation

## 2019-10-17 ENCOUNTER — Encounter: Payer: Self-pay | Admitting: *Deleted

## 2019-11-13 ENCOUNTER — Encounter: Payer: BLUE CROSS/BLUE SHIELD | Admitting: Nurse Practitioner

## 2019-11-22 ENCOUNTER — Other Ambulatory Visit: Payer: Self-pay | Admitting: Nurse Practitioner

## 2019-11-22 DIAGNOSIS — I10 Essential (primary) hypertension: Secondary | ICD-10-CM | POA: Diagnosis not present

## 2019-11-22 DIAGNOSIS — Z125 Encounter for screening for malignant neoplasm of prostate: Secondary | ICD-10-CM | POA: Diagnosis not present

## 2019-11-22 DIAGNOSIS — Z0001 Encounter for general adult medical examination with abnormal findings: Secondary | ICD-10-CM | POA: Diagnosis not present

## 2019-11-22 DIAGNOSIS — E1165 Type 2 diabetes mellitus with hyperglycemia: Secondary | ICD-10-CM | POA: Diagnosis not present

## 2019-11-23 LAB — COMPREHENSIVE METABOLIC PANEL
ALT: 14 IU/L (ref 0–44)
AST: 11 IU/L (ref 0–40)
Albumin/Globulin Ratio: 1.5 (ref 1.2–2.2)
Albumin: 4.4 g/dL (ref 3.8–4.8)
Alkaline Phosphatase: 105 IU/L (ref 39–117)
BUN/Creatinine Ratio: 17 (ref 10–24)
BUN: 20 mg/dL (ref 8–27)
Bilirubin Total: 0.5 mg/dL (ref 0.0–1.2)
CO2: 24 mmol/L (ref 20–29)
Calcium: 9.8 mg/dL (ref 8.6–10.2)
Chloride: 98 mmol/L (ref 96–106)
Creatinine, Ser: 1.18 mg/dL (ref 0.76–1.27)
GFR calc Af Amer: 73 mL/min/{1.73_m2} (ref >59)
GFR calc non Af Amer: 63 mL/min/{1.73_m2} (ref >59)
Globulin, Total: 2.9 g/dL (ref 1.5–4.5)
Glucose: 174 mg/dL — ABNORMAL HIGH (ref 65–99)
Potassium: 4.2 mmol/L (ref 3.5–5.2)
Sodium: 136 mmol/L (ref 134–144)
Total Protein: 7.3 g/dL (ref 6.0–8.5)

## 2019-11-23 LAB — CBC
Hematocrit: 42.7 % (ref 37.5–51.0)
Hemoglobin: 13.9 g/dL (ref 13.0–17.7)
MCH: 27.5 pg (ref 26.6–33.0)
MCHC: 32.6 g/dL (ref 31.5–35.7)
MCV: 84 fL (ref 79–97)
Platelets: 206 10*3/uL (ref 150–450)
RBC: 5.06 x10E6/uL (ref 4.14–5.80)
RDW: 13.7 % (ref 11.6–15.4)
WBC: 8.6 10*3/uL (ref 3.4–10.8)

## 2019-11-23 LAB — PSA: Prostate Specific Ag, Serum: 0.4 ng/mL (ref 0.0–4.0)

## 2019-11-23 LAB — LIPID PANEL W/O CHOL/HDL RATIO
Cholesterol, Total: 155 mg/dL (ref 100–199)
HDL: 49 mg/dL (ref >39)
LDL Chol Calc (NIH): 96 mg/dL (ref 0–99)
Triglycerides: 47 mg/dL (ref 0–149)
VLDL Cholesterol Cal: 10 mg/dL (ref 5–40)

## 2019-11-23 LAB — HCV COMMENT:

## 2019-11-23 LAB — T4, FREE: Free T4: 1.36 ng/dL (ref 0.82–1.77)

## 2019-11-23 LAB — TSH: TSH: 0.879 u[IU]/mL (ref 0.450–4.500)

## 2019-11-23 LAB — HEPATITIS C ANTIBODY (REFLEX): HCV Ab: <0.1 s/co ratio (ref 0.0–0.9)

## 2019-11-23 NOTE — Progress Notes (Signed)
Review labs at next visit 12/14/2019

## 2019-12-12 ENCOUNTER — Telehealth: Payer: Self-pay

## 2019-12-12 NOTE — Telephone Encounter (Signed)
Confirmed appointment with patient and screened for covid. klh 

## 2019-12-14 ENCOUNTER — Encounter: Payer: BLUE CROSS/BLUE SHIELD | Admitting: Adult Health

## 2019-12-26 ENCOUNTER — Telehealth: Payer: Self-pay

## 2019-12-26 NOTE — Telephone Encounter (Signed)
Confirmed appointment on 12/28/2019 and screened for covid. klh 

## 2019-12-28 ENCOUNTER — Ambulatory Visit (INDEPENDENT_AMBULATORY_CARE_PROVIDER_SITE_OTHER): Payer: BLUE CROSS/BLUE SHIELD | Admitting: Adult Health

## 2019-12-28 ENCOUNTER — Other Ambulatory Visit: Payer: Self-pay

## 2019-12-28 ENCOUNTER — Encounter: Payer: Self-pay | Admitting: Adult Health

## 2019-12-28 VITALS — BP 150/82 | HR 70 | Temp 97.4°F | Resp 16 | Ht 68.0 in | Wt 181.0 lb

## 2019-12-28 DIAGNOSIS — N529 Male erectile dysfunction, unspecified: Secondary | ICD-10-CM

## 2019-12-28 DIAGNOSIS — F17219 Nicotine dependence, cigarettes, with unspecified nicotine-induced disorders: Secondary | ICD-10-CM

## 2019-12-28 DIAGNOSIS — Z0001 Encounter for general adult medical examination with abnormal findings: Secondary | ICD-10-CM | POA: Diagnosis not present

## 2019-12-28 DIAGNOSIS — E1165 Type 2 diabetes mellitus with hyperglycemia: Secondary | ICD-10-CM

## 2019-12-28 DIAGNOSIS — I10 Essential (primary) hypertension: Secondary | ICD-10-CM

## 2019-12-28 DIAGNOSIS — R300 Dysuria: Secondary | ICD-10-CM | POA: Diagnosis not present

## 2019-12-28 LAB — POCT GLYCOSYLATED HEMOGLOBIN (HGB A1C): Hemoglobin A1C: 8.5 % — AB (ref 4.0–5.6)

## 2019-12-28 MED ORDER — CANAGLIFLOZIN-METFORMIN HCL 50-1000 MG PO TABS
1.0000 | ORAL_TABLET | Freq: Two times a day (BID) | ORAL | 2 refills | Status: DC
Start: 2019-12-28 — End: 2020-02-07

## 2019-12-28 MED ORDER — SILDENAFIL CITRATE 100 MG PO TABS: 100.0000 mg | ORAL_TABLET | Freq: Every day | ORAL | 3 refills | Status: DC | PRN

## 2019-12-28 NOTE — Progress Notes (Signed)
[facility] [address] [place], [place] [address]  Internal MEDICINE  Office Visit Note  Patient Name: Matthew Gay  [id number]  [id number]  Date of Service: 12/28/2019  Chief Complaint  Patient presents with  . Annual Exam  . Diabetes  . Hypertension  . Quality Metric Gaps    diabetic eye exam and foot exam      HPI Pt is here for routine health maintenance examination reports overall feels healthy and in good health. A1C today is 8.5, continues to improve each visit, still elevated and above recommended levels. States when he remembers to check his AM fasting glucose at home they average between 150-160's. BP today is elevated, has not been elevated in the office before, was improved on repeat manual check. Denies chest pain, palpitations or shortness of breath. Mentioned he was unable to afford his prescription for viagra, Good Rx card provided to patient to get medication from [facility] at a more affordable price. Continues to work night shifts, only working 8 hour shifts at the time, has been able to remember to take his medications as prescribed now that he is down to 8 hour shifts. Reviewed recent lab results, unremarkable results. No acute issues to discuss today.  Current Medication: Outpatient Encounter Medications as of 12/28/2019  Medication Sig  . atorvastatin (LIPITOR) 20 MG tablet Take 1 tablet(s) by mouth at bedtime for cholesterol.  . Blood Glucose Monitoring Suppl (ONE TOUCH ULTRA MINI) w/Device KIT Use as directed diagnosis e11.65  . gabapentin (NEURONTIN) 100 MG capsule TAKE ONE CAPSULE BY MOUTH TWICE A DAY FOR DIABETIC NEUROPATHY  . glipiZIDE (GLUCOTROL) 10 MG tablet Take 1 tablet (10 mg total) by mouth 2 (two) times daily before a meal.  . glucose blood test strip 1 each by Other route daily. Check blood sugars once daily and as needed.  E11.65  . HYDROcodone-acetaminophen (NORCO) 5-325 MG tablet Take 1 tablet by mouth every 6 (six) hours as  needed for moderate pain.  Marland Kitchen ibuprofen (ADVIL) 800 MG tablet Take 1 tablet (800 mg total) by mouth every 8 (eight) hours as needed for moderate pain.  Marland Kitchen lisinopril-hydrochlorothiazide (ZESTORETIC) 20-12.5 MG tablet Take One PO QD for blood pressure,  . metFORMIN (GLUCOPHAGE) 850 MG tablet Take 1 tablet (850 mg total) by mouth 2 (two) times daily with a meal.  . sildenafil (VIAGRA) 100 MG tablet Take 1 tablet (100 mg total) by mouth daily as needed for erectile dysfunction.  . tizanidine (ZANAFLEX) 2 MG capsule Take 2 mg by mouth 3 (three) times daily.  . traZODone (DESYREL) 50 MG tablet Take 0.5-1 tablets (25-50 mg total) by mouth at bedtime as needed for sleep.   No facility-administered encounter medications on file as of 12/28/2019.    Surgical History: Past Surgical History:  Procedure Laterality Date  . colon cancer    . COLON SURGERY      Medical History: Past Medical History:  Diagnosis Date  . Diabetes mellitus without complication ([place])   . Hyperlipemia   . Hypertension     Family History: Family History  Family history unknown: Yes      Review of Systems  Constitutional: Negative.  Negative for chills, fatigue and unexpected weight change.  HENT: Negative.  Negative for congestion, rhinorrhea, sneezing and sore throat.   Eyes: Negative for redness.  Respiratory: Negative.  Negative for cough, chest tightness and shortness of breath.   Cardiovascular: Negative.  Negative for chest pain and palpitations.  Gastrointestinal: Negative.  Negative for abdominal pain, constipation, diarrhea, nausea and vomiting.  Endocrine: Negative.   Genitourinary: Negative.  Negative for dysuria and frequency.  Musculoskeletal: Negative.  Negative for arthralgias, back pain, joint swelling and neck pain.  Skin: Negative.  Negative for rash.  Allergic/Immunologic: Negative.   Neurological: Negative.  Negative for tremors and numbness.  Hematological: Negative for adenopathy. Does not  bruise/bleed easily.  Psychiatric/Behavioral: Negative.  Negative for behavioral problems, sleep disturbance and suicidal ideas. The patient is not nervous/anxious.      Vital Signs: BP (!) 172/89   Pulse 70   Temp (!) 97.4 F (36.3 C)   Resp 16   Ht '5\' 8"'  (1.727 m)   Wt 181 lb (82.1 kg)   SpO2 98%   BMI 27.52 kg/m  Repeat BP 150/82, manual.  Physical Exam Vitals and nursing note reviewed.  Constitutional:      General: He is not in acute distress.    Appearance: He is well-developed. He is not diaphoretic.  HENT:     Head: Normocephalic and atraumatic.     Mouth/Throat:     Pharynx: No oropharyngeal exudate.  Eyes:     Pupils: Pupils are equal, round, and reactive to light.  Neck:     Thyroid: No thyromegaly.     Vascular: No JVD.     Trachea: No tracheal deviation.  Cardiovascular:     Rate and Rhythm: Normal rate and regular rhythm.     Heart sounds: Normal heart sounds. No murmur. No friction rub. No gallop.   Pulmonary:     Effort: Pulmonary effort is normal. No respiratory distress.     Breath sounds: Normal breath sounds. No wheezing or rales.  Chest:     Chest wall: No tenderness.  Abdominal:     Palpations: Abdomen is soft.  Musculoskeletal:        General: Normal range of motion.     Cervical back: Normal range of motion and neck supple.  Lymphadenopathy:     Cervical: No cervical adenopathy.  Skin:    General: Skin is warm and dry.  Neurological:     Mental Status: He is alert and oriented to person, place, and time.     Cranial Nerves: No cranial nerve deficit.  Psychiatric:        Behavior: Behavior normal.        Thought Content: Thought content normal.        Judgment: Judgment normal.    LABS: Recent Results (from the past 2160 hour(s))  Comprehensive metabolic panel     Status: Abnormal   Collection Time: 11/22/19  8:04 AM  Result Value Ref Range   Glucose 174 (H) 65 - 99 mg/dL   BUN 20 8 - 27 mg/dL   Creatinine, Ser 1.18 0.76 - 1.27  mg/dL   GFR calc non Af Amer 63 >59 mL/min/1.73   GFR calc Af Amer 73 >59 mL/min/1.73   BUN/Creatinine Ratio 17 10 - 24   Sodium 136 134 - 144 mmol/L   Potassium 4.2 3.5 - 5.2 mmol/L   Chloride 98 96 - 106 mmol/L   CO2 24 20 - 29 mmol/L   Calcium 9.8 8.6 - 10.2 mg/dL   Total Protein 7.3 6.0 - 8.5 g/dL   Albumin 4.4 3.8 - 4.8 g/dL   Globulin, Total 2.9 1.5 - 4.5 g/dL   Albumin/Globulin Ratio 1.5 1.2 - 2.2   Bilirubin Total 0.5 0.0 - 1.2 mg/dL   Alkaline Phosphatase 105 39 - 117  IU/L   AST 11 0 - 40 IU/L   ALT 14 0 - 44 IU/L  CBC     Status: None   Collection Time: 11/22/19  8:04 AM  Result Value Ref Range   WBC 8.6 3.4 - 10.8 x10E3/uL   RBC 5.06 4.14 - 5.80 x10E6/uL   Hemoglobin 13.9 13.0 - 17.7 g/dL   Hematocrit 42.7 37.5 - 51.0 %   MCV 84 79 - 97 fL   MCH 27.5 26.6 - 33.0 pg   MCHC 32.6 31.5 - 35.7 g/dL   RDW 13.7 11.6 - 15.4 %   Platelets 206 150 - 450 x10E3/uL  Lipid Panel w/o Chol/HDL Ratio     Status: None   Collection Time: 11/22/19  8:04 AM  Result Value Ref Range   Cholesterol, Total 155 100 - 199 mg/dL   Triglycerides 47 0 - 149 mg/dL   HDL 49 >39 mg/dL   VLDL Cholesterol Cal 10 5 - 40 mg/dL   LDL Chol Calc (NIH) 96 0 - 99 mg/dL  T4, free     Status: None   Collection Time: 11/22/19  8:04 AM  Result Value Ref Range   Free T4 1.36 0.82 - 1.77 ng/dL  TSH     Status: None   Collection Time: 11/22/19  8:04 AM  Result Value Ref Range   TSH 0.879 0.450 - 4.500 uIU/mL  PSA     Status: None   Collection Time: 11/22/19  8:04 AM  Result Value Ref Range   Prostate Specific Ag, Serum 0.4 0.0 - 4.0 ng/mL    Comment: Roche ECLIA methodology. According to the American Urological Association, Serum PSA should decrease and remain at undetectable levels after radical prostatectomy. The AUA defines biochemical recurrence as an initial PSA value 0.2 ng/mL or greater followed by a subsequent confirmatory PSA value 0.2 ng/mL or greater. Values obtained with different assay  methods or kits cannot be used interchangeably. Results cannot be interpreted as absolute evidence of the presence or absence of malignant disease.   Hepatitis c antibody (reflex)     Status: None   Collection Time: 11/22/19  8:04 AM  Result Value Ref Range   HCV Ab <0.1 0.0 - 0.9 s/co ratio  HCV Comment:     Status: None   Collection Time: 11/22/19  8:04 AM  Result Value Ref Range   Comment: Comment     Comment: Non reactive HCV antibody screen is consistent with no HCV infection, unless recent infection is suspected or other evidence exists to indicate HCV infection.   POCT HgB A1C     Status: Abnormal   Collection Time: 12/28/19  2:15 PM  Result Value Ref Range   Hemoglobin A1C 8.5 (A) 4.0 - 5.6 %   HbA1c POC (<> result, manual entry)     HbA1c, POC (prediabetic range)     HbA1c, POC (controlled diabetic range)      Assessment/Plan: 1. Encounter for general adult medical examination with abnormal findings Well appearing 69 year old man. Up to date on PHM. Will seek out referral to GI for colonoscopy in the spring per patient request. Encouraged patient to schedule routine diabetic eye exam for this year.  2. Uncontrolled type 2 diabetes mellitus with hyperglycemia ([place]) Will stop current therapy of glipizide and metformin at this time. Start Invo-Met 50-1000 mg twice a day to improve A1C and blood glucose levels. Discussed with Matthew Gay that if new prescription is too expensive for him to continue on  current therapy. Will monitor A1C in 3 months. - POCT HgB A1C - Canagliflozin-metFORMIN HCl 50-1000 MG TABS; Take 1 tablet by mouth 2 (two) times daily.  Dispense: 60 tablet; Refill: 2  3. Essential hypertension BP elevated with initial reading at today's visit, improved on repeat manual BP. Continue on current therapy and continue to monitor. Encouraged patient to obtain a BP machine in order to monitor his BP readings at home.  4. Erectile dysfunction, unspecified erectile  dysfunction type Good Rx card provided to patient in hopes to make medications more affordable. Will assess medication effectiveness and continue to monitor. - sildenafil (VIAGRA) 100 MG tablet; Take 1 tablet (100 mg total) by mouth daily as needed for erectile dysfunction.  Dispense: 10 tablet; Refill: 3  5. Cigarette nicotine dependence with nicotine-induced disorder Smoking cessation counseling: 1. Pt acknowledges the risks of long term smoking, she will try to quite smoking. 2. Options for different medications including nicotine products, chewing gum, patch etc, Wellbutrin and Chantix is discussed 3. Goal and date of compete cessation is discussed 4. Total time spent in smoking cessation is 15 min.  6. Dysuria - UA/M w/rflx Culture, Routine   General Counseling: Matthew Gay verbalizes understanding of the findings of todays visit and agrees with plan of treatment. I have discussed any further diagnostic evaluation that may be needed or ordered today. We also reviewed his medications today. he has been encouraged to call the office with any questions or concerns that should arise related to todays visit.   Orders Placed This Encounter  Procedures  . POCT HgB A1C    No orders of the defined types were placed in this encounter.   Time spent: 30 Minutes   This patient was seen by Orson Gear AGNP-C in Collaboration with Dr Lavera Guise as a part of collaborative care agreement    Kendell Bane AGNP-C Internal Medicine

## 2019-12-29 LAB — UA/M W/RFLX CULTURE, ROUTINE
Bilirubin, UA: NEGATIVE
Glucose, UA: NEGATIVE
Ketones, UA: NEGATIVE
Leukocytes,UA: NEGATIVE
Nitrite, UA: NEGATIVE
Protein,UA: NEGATIVE
RBC, UA: NEGATIVE
Specific Gravity, UA: 1.019 (ref 1.005–1.030)
Urobilinogen, Ur: 0.2 mg/dL (ref 0.2–1.0)
pH, UA: 5.5 (ref 5.0–7.5)

## 2019-12-29 LAB — MICROSCOPIC EXAMINATION
Bacteria, UA: NONE SEEN
Casts: NONE SEEN /lpf
Epithelial Cells (non renal): NONE SEEN /hpf (ref 0–10)
RBC: NONE SEEN /hpf (ref 0–2)

## 2020-02-03 ENCOUNTER — Ambulatory Visit: Payer: BLUE CROSS/BLUE SHIELD | Attending: Internal Medicine

## 2020-02-03 DIAGNOSIS — Z23 Encounter for immunization: Secondary | ICD-10-CM

## 2020-02-03 NOTE — Progress Notes (Signed)
   Covid-19 Vaccination Clinic  Name:  Matthew Gay    MRN: [id number] DOB: 05-26-1951  02/03/2020  Matthew Gay was observed post Covid-19 immunization for 15 minutes without incident. He was provided with Vaccine Information Sheet and instruction to access the V-Safe system.   Matthew Gay was instructed to call 911 with any severe reactions post vaccine: Marland Kitchen Difficulty breathing  . Swelling of face and throat  . A fast heartbeat  . A bad rash all over body  . Dizziness and weakness   Immunizations Administered    Name Date Dose VIS Date Route   Pfizer COVID-19 Vaccine 02/03/2020  4:54 PM 0.3 mL 10/27/2019 Intramuscular   Manufacturer: [employer], [employer]   Lot: [id number]   NDC: [address]-8

## 2020-02-07 ENCOUNTER — Other Ambulatory Visit: Payer: Self-pay

## 2020-02-07 DIAGNOSIS — I10 Essential (primary) hypertension: Secondary | ICD-10-CM

## 2020-02-07 DIAGNOSIS — E1165 Type 2 diabetes mellitus with hyperglycemia: Secondary | ICD-10-CM

## 2020-02-07 MED ORDER — CANAGLIFLOZIN-METFORMIN HCL 50-1000 MG PO TABS: 1.0000 | ORAL_TABLET | Freq: Two times a day (BID) | ORAL | 2 refills | Status: DC

## 2020-02-07 MED ORDER — ATORVASTATIN CALCIUM 20 MG PO TABS: ORAL_TABLET | ORAL | 1 refills | Status: DC

## 2020-02-07 MED ORDER — LISINOPRIL-HYDROCHLOROTHIAZIDE 20-12.5 MG PO TABS: ORAL_TABLET | ORAL | 1 refills | Status: DC

## 2020-02-24 ENCOUNTER — Ambulatory Visit: Payer: BLUE CROSS/BLUE SHIELD | Attending: Internal Medicine

## 2020-02-24 DIAGNOSIS — Z23 Encounter for immunization: Secondary | ICD-10-CM

## 2020-02-24 NOTE — Progress Notes (Signed)
   Covid-19 Vaccination Clinic  Name:  TYRICK DUNAGAN    MRN: [id number] DOB: 1951/09/18  02/24/2020  Mr. Usman was observed post Covid-19 immunization for 15 minutes without incident. He was provided with Vaccine Information Sheet and instruction to access the V-Safe system.   Mr. Olafson was instructed to call 911 with any severe reactions post vaccine: Marland Kitchen Difficulty breathing  . Swelling of face and throat  . A fast heartbeat  . A bad rash all over body  . Dizziness and weakness   Immunizations Administered    Name Date Dose VIS Date Route   Pfizer COVID-19 Vaccine 02/24/2020  4:46 PM 0.3 mL 10/27/2019 Intramuscular   Manufacturer: [employer], [employer]   Lot: [id number]   NDC: [address]-9

## 2020-03-22 ENCOUNTER — Telehealth: Payer: Self-pay

## 2020-03-22 NOTE — Telephone Encounter (Signed)
Tried contacting patient to confirm appointment on 03/26/2020 no voicemail. klh

## 2020-03-26 ENCOUNTER — Ambulatory Visit: Payer: BLUE CROSS/BLUE SHIELD | Admitting: Adult Health

## 2020-03-27 ENCOUNTER — Other Ambulatory Visit: Payer: Self-pay

## 2020-03-27 DIAGNOSIS — M064 Inflammatory polyarthropathy: Secondary | ICD-10-CM

## 2020-03-27 MED ORDER — IBUPROFEN 800 MG PO TABS: 800.0000 mg | ORAL_TABLET | Freq: Three times a day (TID) | ORAL | 3 refills | Status: DC | PRN

## 2020-04-09 ENCOUNTER — Telehealth: Payer: Self-pay

## 2020-04-09 NOTE — Telephone Encounter (Signed)
Tried contacting patient to confirm appointment on 04/11/2020. klh

## 2020-04-11 ENCOUNTER — Ambulatory Visit: Payer: BLUE CROSS/BLUE SHIELD | Admitting: Adult Health

## 2020-04-11 ENCOUNTER — Telehealth: Payer: Self-pay

## 2020-04-11 NOTE — Telephone Encounter (Signed)
Patient rescheduled appointment on 04/11/2020 to 04/17/2020. klh

## 2020-04-12 ENCOUNTER — Telehealth: Payer: Self-pay

## 2020-04-12 NOTE — Telephone Encounter (Signed)
Confirmed appointment on 06/0/2021. klh

## 2020-04-17 ENCOUNTER — Ambulatory Visit: Payer: 59 | Admitting: Adult Health

## 2020-04-17 ENCOUNTER — Encounter: Payer: Self-pay | Admitting: Adult Health

## 2020-04-17 ENCOUNTER — Other Ambulatory Visit: Payer: Self-pay

## 2020-04-17 VITALS — BP 130/80 | HR 76 | Temp 97.7°F | Resp 16 | Ht 68.0 in | Wt 172.6 lb

## 2020-04-17 DIAGNOSIS — F17219 Nicotine dependence, cigarettes, with unspecified nicotine-induced disorders: Secondary | ICD-10-CM | POA: Diagnosis not present

## 2020-04-17 DIAGNOSIS — E1165 Type 2 diabetes mellitus with hyperglycemia: Secondary | ICD-10-CM | POA: Diagnosis not present

## 2020-04-17 DIAGNOSIS — I10 Essential (primary) hypertension: Secondary | ICD-10-CM

## 2020-04-17 DIAGNOSIS — N529 Male erectile dysfunction, unspecified: Secondary | ICD-10-CM | POA: Diagnosis not present

## 2020-04-17 DIAGNOSIS — R69 Illness, unspecified: Secondary | ICD-10-CM | POA: Diagnosis not present

## 2020-04-17 LAB — POCT GLYCOSYLATED HEMOGLOBIN (HGB A1C): Hemoglobin A1C: 8 % — AB (ref 4.0–5.6)

## 2020-04-17 MED ORDER — GLUCOSE BLOOD VI STRP: 1.0000 | ORAL_STRIP | Freq: Every day | 3 refills | Status: DC

## 2020-04-17 NOTE — Progress Notes (Signed)
[facility] [place], [place] [address]  Internal MEDICINE  Office Visit Note  Patient Name: Matthew Gay  [id number]  [id number]  Date of Service: 04/17/2020  Chief Complaint  Patient presents with  . Follow-up  . Diabetes  . Hyperlipidemia  . Hypertension    HPI  Pt is here for follow up on DM, HTN and HLD.  He reports he hasn't checked his blood sugars lately.  He has continued to watch his diet.  He is walking his dog at least a mile once or twice a day. He has lost 9 pounds since his last visit. His blood pressure is controlled.  He Denies Chest pain, Shortness of breath, palpitations, headache, or blurred vision.      Current Medication: Outpatient Encounter Medications as of 04/17/2020  Medication Sig  . atorvastatin (LIPITOR) 20 MG tablet Take 1 tablet(s) by mouth at bedtime for cholesterol.  . Blood Glucose Monitoring Suppl (ONE TOUCH ULTRA MINI) w/Device KIT Use as directed diagnosis e11.65  . Canagliflozin-metFORMIN HCl 50-1000 MG TABS Take 1 tablet by mouth 2 (two) times daily.  Marland Kitchen gabapentin (NEURONTIN) 100 MG capsule TAKE ONE CAPSULE BY MOUTH TWICE A DAY FOR DIABETIC NEUROPATHY  . glucose blood test strip 1 each by Other route daily. Check blood sugars once daily and as needed.  E11.65  . HYDROcodone-acetaminophen (NORCO) 5-325 MG tablet Take 1 tablet by mouth every 6 (six) hours as needed for moderate pain.  Marland Kitchen ibuprofen (ADVIL) 800 MG tablet Take 1 tablet (800 mg total) by mouth every 8 (eight) hours as needed for moderate pain.  Marland Kitchen lisinopril-hydrochlorothiazide (ZESTORETIC) 20-12.5 MG tablet Take One PO QD for blood pressure,  . sildenafil (VIAGRA) 100 MG tablet Take 1 tablet (100 mg total) by mouth daily as needed for erectile dysfunction.  . tizanidine (ZANAFLEX) 2 MG capsule Take 2 mg by mouth 3 (three) times daily.  . traZODone (DESYREL) 50 MG tablet Take 0.5-1 tablets (25-50 mg total) by mouth at bedtime as needed for sleep.   No  facility-administered encounter medications on file as of 04/17/2020.    Surgical History: Past Surgical History:  Procedure Laterality Date  . colon cancer    . COLON SURGERY      Medical History: Past Medical History:  Diagnosis Date  . Diabetes mellitus without complication ([place])   . Hyperlipemia   . Hypertension     Family History: Family History  Family history unknown: Yes    Social History   Socioeconomic History  . Marital status: Married    Spouse name: Not on file  . Number of children: Not on file  . Years of education: Not on file  . Highest education level: Not on file  Occupational History  . Not on file  Tobacco Use  . Smoking status: Current Some Day Smoker    Types: Cigarettes  . Smokeless tobacco: Never Used  Substance and Sexual Activity  . Alcohol use: Yes    Comment: Rarely  . Drug use: No  . Sexual activity: Not on file  Other Topics Concern  . Not on file  Social History Narrative  . Not on file   Social Determinants of Health   Financial Resource Strain:   . Difficulty of Paying Living Expenses:   Food Insecurity:   . Worried About Charity fundraiser in the Last Year:   . Arboriculturist in the Last Year:   Transportation Needs:   . Lack  of Transportation (Medical):   Marland Kitchen Lack of Transportation (Non-Medical):   Physical Activity:   . Days of Exercise per Week:   . Minutes of Exercise per Session:   Stress:   . Feeling of Stress :   Social Connections:   . Frequency of Communication with Friends and Family:   . Frequency of Social Gatherings with Friends and Family:   . Attends Religious Services:   . Active Member of Clubs or Organizations:   . Attends Archivist Meetings:   Marland Kitchen Marital Status:   Intimate Partner Violence:   . Fear of Current or Ex-Partner:   . Emotionally Abused:   Marland Kitchen Physically Abused:   . Sexually Abused:       Review of Systems  Constitutional: Negative.  Negative for chills, fatigue and  unexpected weight change.  HENT: Negative.  Negative for congestion, rhinorrhea, sneezing and sore throat.   Eyes: Negative for redness.  Respiratory: Negative.  Negative for cough, chest tightness and shortness of breath.   Cardiovascular: Negative.  Negative for chest pain and palpitations.  Gastrointestinal: Negative.  Negative for abdominal pain, constipation, diarrhea, nausea and vomiting.  Endocrine: Negative.   Genitourinary: Negative.  Negative for dysuria and frequency.  Musculoskeletal: Negative.  Negative for arthralgias, back pain, joint swelling and neck pain.  Skin: Negative.  Negative for rash.  Allergic/Immunologic: Negative.   Neurological: Negative.  Negative for tremors and numbness.  Hematological: Negative for adenopathy. Does not bruise/bleed easily.  Psychiatric/Behavioral: Negative.  Negative for behavioral problems, sleep disturbance and suicidal ideas. The patient is not nervous/anxious.     Vital Signs: BP (!) 164/83   Pulse 76   Temp 97.7 F (36.5 C)   Resp 16   Ht '5\' 8"'  (1.727 m)   Wt 172 lb 9.6 oz (78.3 kg)   SpO2 99%   BMI 26.24 kg/m    Physical Exam Vitals and nursing note reviewed.  Constitutional:      General: He is not in acute distress.    Appearance: He is well-developed. He is not diaphoretic.  HENT:     Head: Normocephalic and atraumatic.     Mouth/Throat:     Pharynx: No oropharyngeal exudate.  Eyes:     Pupils: Pupils are equal, round, and reactive to light.  Neck:     Thyroid: No thyromegaly.     Vascular: No JVD.     Trachea: No tracheal deviation.  Cardiovascular:     Rate and Rhythm: Normal rate and regular rhythm.     Heart sounds: Normal heart sounds. No murmur. No friction rub. No gallop.   Pulmonary:     Effort: Pulmonary effort is normal. No respiratory distress.     Breath sounds: Normal breath sounds. No wheezing or rales.  Chest:     Chest wall: No tenderness.  Abdominal:     Palpations: Abdomen is soft.      Tenderness: There is no abdominal tenderness. There is no guarding.  Musculoskeletal:        General: Normal range of motion.     Cervical back: Normal range of motion and neck supple.  Lymphadenopathy:     Cervical: No cervical adenopathy.  Skin:    General: Skin is warm and dry.  Neurological:     Mental Status: He is alert and oriented to person, place, and time.     Cranial Nerves: No cranial nerve deficit.  Psychiatric:        Behavior: Behavior normal.  Thought Content: Thought content normal.        Judgment: Judgment normal.    Assessment/Plan: 1. Uncontrolled type 2 diabetes mellitus with hyperglycemia (HCC) A1C continues to come down.  Today is 8.0 - POCT HgB A1C  2. Essential hypertension Controlled, continue current management.   3. Erectile dysfunction, unspecified erectile dysfunction type Pt has tried Viagra, he would like to see urology for consultation. - Ambulatory referral to Urology  4. Cigarette nicotine dependence with nicotine-induced disorder Patient is down to less than 5 cigarettes daily. Smoking cessation counseling: 1. Pt acknowledges the risks of long term smoking, she will try to quite smoking. 2. Options for different medications including nicotine products, chewing gum, patch etc, Wellbutrin and Chantix is discussed 3. Goal and date of compete cessation is discussed 4. Total time spent in smoking cessation is 15 min.    General Counseling: Macoy verbalizes understanding of the findings of todays visit and agrees with plan of treatment. I have discussed any further diagnostic evaluation that may be needed or ordered today. We also reviewed his medications today. he has been encouraged to call the office with any questions or concerns that should arise related to todays visit.    Orders Placed This Encounter  Procedures  . POCT HgB A1C    No orders of the defined types were placed in this encounter.   Time spent: 30  Minutes   This patient was seen by Orson Gear AGNP-C in Collaboration with Dr Lavera Guise as a part of collaborative care agreement     Kendell Bane AGNP-C Internal medicine

## 2020-05-02 ENCOUNTER — Ambulatory Visit (INDEPENDENT_AMBULATORY_CARE_PROVIDER_SITE_OTHER): Payer: 59 | Admitting: Urology

## 2020-05-02 ENCOUNTER — Other Ambulatory Visit: Payer: Self-pay

## 2020-05-02 VITALS — BP 149/90 | HR 74 | Ht 68.0 in | Wt 168.6 lb

## 2020-05-02 DIAGNOSIS — N5201 Erectile dysfunction due to arterial insufficiency: Secondary | ICD-10-CM | POA: Diagnosis not present

## 2020-05-02 MED ORDER — TADALAFIL 20 MG PO TABS: ORAL_TABLET | ORAL | 0 refills | Status: DC

## 2020-05-02 NOTE — Progress Notes (Signed)
05/02/2020 9:29 AM   Matthew Gay [id number]  Referring provider: Kendell Bane, NP [place],  [place] [id number]  Chief Complaint  Patient presents with  . Erectile Dysfunction    HPI: Matthew Gay is a 69 y.o. male seen at the request of Orson Gear, NP for evaluation of a ED  -5+ year history of ED which has progressively worsened -Difficulty achieving and maintaining an erection -Partial erections which are rarely enough for penetration -SHIM 5/25 indicating severe ED -No pain or curvature when he was having erections -Significant organic risk factors including diabetes, hypertension, hyperlipidemia, antihypertensive medications and significant tobacco history with current tobacco use -Trial of sildenafil 100 mg without improvement -Good libido  PMH: Past Medical History:  Diagnosis Date  . Diabetes mellitus without complication ([place])   . Hyperlipemia   . Hypertension     Surgical History: Past Surgical History:  Procedure Laterality Date  . colon cancer    . COLON SURGERY      Home Medications:  Allergies as of 05/02/2020   No Known Allergies     Medication List       Accurate as of May 02, 2020  9:29 AM. If you have any questions, ask your nurse or doctor.        atorvastatin 20 MG tablet Commonly known as: LIPITOR Take 1 tablet(s) by mouth at bedtime for cholesterol.   Canagliflozin-metFORMIN HCl 50-1000 MG Tabs Take 1 tablet by mouth 2 (two) times daily.   gabapentin 100 MG capsule Commonly known as: NEURONTIN TAKE ONE CAPSULE BY MOUTH TWICE A DAY FOR DIABETIC NEUROPATHY   glucose blood test strip 1 each by Other route daily. Check blood sugars once daily and as needed.  E11.65   HYDROcodone-acetaminophen 5-325 MG tablet Commonly known as: Norco Take 1 tablet by mouth every 6 (six) hours as needed for moderate pain.   ibuprofen 800 MG tablet Commonly known as: ADVIL Take 1 tablet (800 mg total) by mouth  every 8 (eight) hours as needed for moderate pain.   lisinopril-hydrochlorothiazide 20-12.5 MG tablet Commonly known as: ZESTORETIC Take One PO QD for blood pressure,   ONE TOUCH ULTRA MINI w/Device Kit Use as directed diagnosis e11.65   sildenafil 100 MG tablet Commonly known as: VIAGRA Take 1 tablet (100 mg total) by mouth daily as needed for erectile dysfunction.   tizanidine 2 MG capsule Commonly known as: ZANAFLEX Take 2 mg by mouth 3 (three) times daily.   traZODone 50 MG tablet Commonly known as: DESYREL Take 0.5-1 tablets (25-50 mg total) by mouth at bedtime as needed for sleep.       Allergies: No Known Allergies  Family History: Family History  Family history unknown: Yes    Social History:  reports that he has been smoking cigarettes. He has never used smokeless tobacco. He reports current alcohol use. He reports that he does not use drugs.   Physical Exam: BP (!) 149/90   Pulse 74   Ht 5' 8" (1.727 m)   Wt 168 lb 9.6 oz (76.5 kg)   BMI 25.64 kg/m   Constitutional:  Alert and oriented, No acute distress. HEENT:  AT, moist mucus membranes.  Trachea midline, no masses. Cardiovascular: No clubbing, cyanosis, or edema. Respiratory: Normal respiratory effort, no increased work of breathing. GI: Abdomen is soft, nontender, nondistended, no abdominal masses GU: Phallus circumcised without lesions or plaques.  Testes descended bilaterally without masses or tenderness.  Normal size bilaterally.  Spermatic cord/epididymis palpably normal bilaterally. Skin: No rashes, bruises or suspicious lesions. Neurologic: Grossly intact, no focal deficits, moving all 4 extremities. Psychiatric: Normal mood and affect.   Assessment & Plan:    1. Erectile dysfunction I discussed his significant organic risk factors and the most likely etiology of his ED being arterial insufficiency.  We discussed use of a second PDE 5 inhibitor however unlikely it will be effective based on  symptom severity.  Second line options were discussed including intracavernosal injections and vacuum erection devices.  Penile implant surgery was also discussed.  He was provided literature on these options.  He was initially interested in a second PDE 5 inhibitor and Rx of tadalafil 20 mg was sent to pharmacy.   Abbie Sons, [place] [address] [place], [place] [address] [phone]

## 2020-05-05 ENCOUNTER — Encounter: Payer: Self-pay | Admitting: Urology

## 2020-05-27 ENCOUNTER — Other Ambulatory Visit: Payer: Self-pay

## 2020-05-27 DIAGNOSIS — I10 Essential (primary) hypertension: Secondary | ICD-10-CM

## 2020-05-27 MED ORDER — LISINOPRIL-HYDROCHLOROTHIAZIDE 20-12.5 MG PO TABS: ORAL_TABLET | ORAL | 1 refills | Status: DC

## 2020-05-28 ENCOUNTER — Other Ambulatory Visit: Payer: Self-pay

## 2020-05-28 DIAGNOSIS — E1165 Type 2 diabetes mellitus with hyperglycemia: Secondary | ICD-10-CM

## 2020-05-28 MED ORDER — CANAGLIFLOZIN-METFORMIN HCL 50-1000 MG PO TABS: 1.0000 | ORAL_TABLET | Freq: Two times a day (BID) | ORAL | 2 refills | Status: DC

## 2020-05-30 ENCOUNTER — Other Ambulatory Visit: Payer: Self-pay

## 2020-06-12 ENCOUNTER — Other Ambulatory Visit: Payer: Self-pay

## 2020-06-13 ENCOUNTER — Telehealth: Payer: Self-pay

## 2020-06-13 ENCOUNTER — Other Ambulatory Visit: Payer: Self-pay | Admitting: Adult Health

## 2020-06-13 ENCOUNTER — Other Ambulatory Visit: Payer: Self-pay

## 2020-06-13 DIAGNOSIS — E1165 Type 2 diabetes mellitus with hyperglycemia: Secondary | ICD-10-CM

## 2020-06-13 MED ORDER — CANAGLIFLOZIN-METFORMIN HCL 50-1000 MG PO TABS: 1.0000 | ORAL_TABLET | Freq: Two times a day (BID) | ORAL | 2 refills | Status: DC

## 2020-06-13 MED ORDER — DAPAGLIFLOZIN PROPANEDIOL 10 MG PO TABS
10.0000 mg | ORAL_TABLET | Freq: Every day | ORAL | 1 refills | Status: DC
Start: 2020-06-13 — End: 2020-08-07

## 2020-06-13 NOTE — Progress Notes (Signed)
Sent farxiga, as invokanamet is too expensive.

## 2020-06-13 NOTE — Telephone Encounter (Signed)
I sent farxiga.  Lets see what that costs

## 2020-06-14 ENCOUNTER — Telehealth: Payer: Self-pay

## 2020-06-14 ENCOUNTER — Other Ambulatory Visit: Payer: Self-pay

## 2020-06-14 NOTE — Telephone Encounter (Signed)
Try to call we send farxiga no voicemail

## 2020-06-14 NOTE — Telephone Encounter (Signed)
Confirmed and screened for 06-18-20 ov. 

## 2020-06-18 ENCOUNTER — Ambulatory Visit: Payer: 59 | Admitting: Internal Medicine

## 2020-06-28 ENCOUNTER — Encounter: Payer: Self-pay | Admitting: Hospice and Palliative Medicine

## 2020-06-28 ENCOUNTER — Ambulatory Visit: Payer: 59 | Admitting: Hospice and Palliative Medicine

## 2020-06-28 ENCOUNTER — Other Ambulatory Visit: Payer: Self-pay

## 2020-06-28 VITALS — BP 132/74 | HR 64 | Temp 97.5°F | Resp 16 | Ht 68.0 in | Wt 177.2 lb

## 2020-06-28 DIAGNOSIS — E1165 Type 2 diabetes mellitus with hyperglycemia: Secondary | ICD-10-CM

## 2020-06-28 DIAGNOSIS — F17219 Nicotine dependence, cigarettes, with unspecified nicotine-induced disorders: Secondary | ICD-10-CM | POA: Diagnosis not present

## 2020-06-28 DIAGNOSIS — I10 Essential (primary) hypertension: Secondary | ICD-10-CM

## 2020-06-28 DIAGNOSIS — R69 Illness, unspecified: Secondary | ICD-10-CM | POA: Diagnosis not present

## 2020-06-28 DIAGNOSIS — N529 Male erectile dysfunction, unspecified: Secondary | ICD-10-CM | POA: Diagnosis not present

## 2020-06-28 DIAGNOSIS — M064 Inflammatory polyarthropathy: Secondary | ICD-10-CM

## 2020-06-28 NOTE — Progress Notes (Signed)
[facility] [place], [place] [address]  Internal MEDICINE  Office Visit Note  Patient Name: Matthew Gay  [id number]  [id number]  Date of Service: 06/28/2020  Chief Complaint  Patient presents with  . Follow-up    change diabetes meds, needs meds for ED  . Diabetes  . Hyperlipidemia  . Hypertension    HPI Patient is here for routine follow-up. Was recently started on Farxiga for his diabetes in June. His last A1C was checked June 2nd and was 8.0, will need to recheck 9/2 for monitoring. He reports he has not been checking his blood sugars at home. He reports that the [place] seems to be making his use the bathroom more frequently. Discussed with him since he works night shift to take the medication when he wakes up before going to work to prevent him from waking up during the day to use the restroom while trying to sleep. Discussed he needs to schedule an appointment for his annual eye exam, he will call [facility] to do this. Was seen by urologist for erectile dysfunction and was started on Cialis. He has been using this medication and he reports it has been working well. He is still exercising daily by walking his dog at least 1 mile. Encouraged to continue with daily exercise and adopting healthy eating habits. BP well controlled today.  Current Medication: Outpatient Encounter Medications as of 06/28/2020  Medication Sig  . atorvastatin (LIPITOR) 20 MG tablet Take 1 tablet(s) by mouth at bedtime for cholesterol.  . Blood Glucose Monitoring Suppl (ONE TOUCH ULTRA MINI) w/Device KIT Use as directed diagnosis e11.65  . dapagliflozin propanediol (FARXIGA) 10 MG TABS tablet Take 1 tablet (10 mg total) by mouth daily before breakfast.  . gabapentin (NEURONTIN) 100 MG capsule TAKE ONE CAPSULE BY MOUTH TWICE A DAY FOR DIABETIC NEUROPATHY  . glucose blood test strip 1 each by Other route daily. Check blood sugars once daily and as needed.  E11.65  .  HYDROcodone-acetaminophen (NORCO) 5-325 MG tablet Take 1 tablet by mouth every 6 (six) hours as needed for moderate pain.  Marland Kitchen ibuprofen (ADVIL) 800 MG tablet Take 1 tablet (800 mg total) by mouth every 8 (eight) hours as needed for moderate pain.  Marland Kitchen lisinopril-hydrochlorothiazide (ZESTORETIC) 20-12.5 MG tablet Take One PO QD for blood pressure,  . tadalafil (CIALIS) 20 MG tablet 1 tab 1 hour prior to intercourse  . tizanidine (ZANAFLEX) 2 MG capsule Take 2 mg by mouth 3 (three) times daily.  . traZODone (DESYREL) 50 MG tablet Take 0.5-1 tablets (25-50 mg total) by mouth at bedtime as needed for sleep.   No facility-administered encounter medications on file as of 06/28/2020.    Surgical History: Past Surgical History:  Procedure Laterality Date  . colon cancer    . COLON SURGERY      Medical History: Past Medical History:  Diagnosis Date  . Diabetes mellitus without complication ([place])   . Hyperlipemia   . Hypertension     Family History: Family History  Family history unknown: Yes    Social History   Socioeconomic History  . Marital status: Married    Spouse name: Not on file  . Number of children: Not on file  . Years of education: Not on file  . Highest education level: Not on file  Occupational History  . Not on file  Tobacco Use  . Smoking status: Current Some Day Smoker    Types: Cigarettes  . Smokeless  tobacco: Never Used  Vaping Use  . Vaping Use: Never used  Substance and Sexual Activity  . Alcohol use: Yes    Comment: Rarely  . Drug use: No  . Sexual activity: Not on file  Other Topics Concern  . Not on file  Social History Narrative  . Not on file   Social Determinants of Health   Financial Resource Strain:   . Difficulty of Paying Living Expenses:   Food Insecurity:   . Worried About Charity fundraiser in the Last Year:   . Arboriculturist in the Last Year:   Transportation Needs:   . Film/video editor (Medical):   Marland Kitchen Lack of  Transportation (Non-Medical):   Physical Activity:   . Days of Exercise per Week:   . Minutes of Exercise per Session:   Stress:   . Feeling of Stress :   Social Connections:   . Frequency of Communication with Friends and Family:   . Frequency of Social Gatherings with Friends and Family:   . Attends Religious Services:   . Active Member of Clubs or Organizations:   . Attends Archivist Meetings:   Marland Kitchen Marital Status:   Intimate Partner Violence:   . Fear of Current or Ex-Partner:   . Emotionally Abused:   Marland Kitchen Physically Abused:   . Sexually Abused:     Review of Systems  Constitutional: Negative for fatigue and unexpected weight change.  HENT: Negative for congestion, sneezing and sore throat.   Eyes: Negative for photophobia and visual disturbance.  Respiratory: Negative for cough, chest tightness and shortness of breath.   Cardiovascular: Negative for chest pain, palpitations and leg swelling.  Gastrointestinal: Negative for abdominal pain, constipation, diarrhea, nausea and vomiting.  Genitourinary: Negative for dysuria and frequency.  Musculoskeletal: Negative for arthralgias, back pain, joint swelling and neck pain.  Skin: Negative for color change.  Neurological: Negative.  Negative for tremors, weakness and numbness.  Hematological: Negative for adenopathy. Does not bruise/bleed easily.  Psychiatric/Behavioral: Negative for behavioral problems (Depression), sleep disturbance and suicidal ideas. The patient is not nervous/anxious.     Vital Signs: BP 132/74   Pulse 64   Temp (!) 97.5 F (36.4 C)   Resp 16   Ht '5\' 8"'  (1.727 m)   Wt 177 lb 3.2 oz (80.4 kg)   SpO2 99%   BMI 26.94 kg/m    Physical Exam Constitutional:      Appearance: Normal appearance. He is normal weight.  HENT:     Mouth/Throat:     Mouth: Mucous membranes are moist.     Pharynx: Oropharynx is clear.  Cardiovascular:     Rate and Rhythm: Normal rate and regular rhythm.     Pulses:  Normal pulses.     Heart sounds: Normal heart sounds.  Pulmonary:     Effort: Pulmonary effort is normal.     Breath sounds: Normal breath sounds.  Abdominal:     General: Abdomen is flat. Bowel sounds are normal.  Musculoskeletal:        General: Normal range of motion.     Cervical back: Normal range of motion.  Skin:    General: Skin is warm.  Neurological:     General: No focal deficit present.     Mental Status: He is alert and oriented to person, place, and time. Mental status is at baseline.  Psychiatric:        Mood and Affect: Mood normal.  Thought Content: Thought content normal.    Assessment/Plan: 1. Uncontrolled type 2 diabetes mellitus with hyperglycemia (HCC) Currently using Farxiga for DM control. He does not check his blood sugar levels at home on a routine basis. Discussed the importance of getting in the habit of checking his levels. Also encouraged to keep a log of his readings and bring it to future visits. He does have a glucometer and supplies to check his levels. Will repeat his A1C at next visit 9/2.  2. Essential hypertension BP stable today, continue with current therapy and continue to monitor.  3. Erectile dysfunction, unspecified erectile dysfunction type Was started on Cialis by urology recently. Reports that Cialis is helping with E.D. Will continue to monitor.  General Counseling: Froilan verbalizes understanding of the findings of todays visit and agrees with plan of treatment. I have discussed any further diagnostic evaluation that may be needed or ordered today. We also reviewed his medications today. he has been encouraged to call the office with any questions or concerns that should arise related to todays visit.    Time spent: 35 Minutes   This patient was seen by Theodoro Grist AGNP-C in Collaboration with Dr Lavera Guise as a part of collaborative care agreement     Tanna Furry. Abisola Carrero AGNP-C Internal medicine

## 2020-07-02 ENCOUNTER — Telehealth: Payer: Self-pay

## 2020-07-02 NOTE — Telephone Encounter (Signed)
Authorization approved for Farxiga 10 MG tab from 07/02/2020 through 07/02/2021 [facility]

## 2020-07-04 ENCOUNTER — Encounter: Payer: Self-pay | Admitting: Hospice and Palliative Medicine

## 2020-07-04 DIAGNOSIS — M064 Inflammatory polyarthropathy: Secondary | ICD-10-CM | POA: Insufficient documentation

## 2020-07-16 ENCOUNTER — Telehealth: Payer: Self-pay

## 2020-07-16 NOTE — Telephone Encounter (Signed)
Unable to LVM for office visit on 9/2

## 2020-07-18 ENCOUNTER — Ambulatory Visit: Payer: 59 | Admitting: Hospice and Palliative Medicine

## 2020-07-25 ENCOUNTER — Telehealth: Payer: Self-pay

## 2020-07-25 NOTE — Telephone Encounter (Signed)
Unable to leave a message and reschd pt's missed appt. Matthew Gay

## 2020-08-07 ENCOUNTER — Other Ambulatory Visit: Payer: Self-pay | Admitting: Adult Health

## 2020-10-16 ENCOUNTER — Other Ambulatory Visit: Payer: Self-pay | Admitting: Adult Health

## 2020-11-06 ENCOUNTER — Other Ambulatory Visit: Payer: Self-pay

## 2020-11-06 DIAGNOSIS — M064 Inflammatory polyarthropathy: Secondary | ICD-10-CM

## 2020-11-06 MED ORDER — IBUPROFEN 800 MG PO TABS: 800.0000 mg | ORAL_TABLET | Freq: Three times a day (TID) | ORAL | 3 refills | Status: DC | PRN

## 2020-11-06 MED ORDER — DAPAGLIFLOZIN PROPANEDIOL 10 MG PO TABS: ORAL_TABLET | ORAL | 1 refills | Status: DC

## 2020-11-18 ENCOUNTER — Other Ambulatory Visit: Payer: Self-pay

## 2020-11-18 DIAGNOSIS — I10 Essential (primary) hypertension: Secondary | ICD-10-CM

## 2020-11-18 MED ORDER — LISINOPRIL-HYDROCHLOROTHIAZIDE 20-12.5 MG PO TABS: ORAL_TABLET | ORAL | 0 refills | Status: DC

## 2021-01-01 ENCOUNTER — Encounter: Payer: 59 | Admitting: Hospice and Palliative Medicine

## 2021-01-02 ENCOUNTER — Other Ambulatory Visit: Payer: Self-pay

## 2021-01-02 DIAGNOSIS — I10 Essential (primary) hypertension: Secondary | ICD-10-CM

## 2021-01-02 MED ORDER — LISINOPRIL-HYDROCHLOROTHIAZIDE 20-12.5 MG PO TABS: ORAL_TABLET | ORAL | 0 refills | Status: DC

## 2021-01-07 ENCOUNTER — Other Ambulatory Visit: Payer: Self-pay | Admitting: Nurse Practitioner

## 2021-01-09 ENCOUNTER — Ambulatory Visit: Payer: 59 | Admitting: Physician Assistant

## 2021-01-23 ENCOUNTER — Encounter: Payer: Self-pay | Admitting: Physician Assistant

## 2021-01-23 ENCOUNTER — Other Ambulatory Visit: Payer: Self-pay

## 2021-01-23 ENCOUNTER — Ambulatory Visit: Payer: 59 | Admitting: Physician Assistant

## 2021-01-23 DIAGNOSIS — E1165 Type 2 diabetes mellitus with hyperglycemia: Secondary | ICD-10-CM

## 2021-01-23 DIAGNOSIS — R5383 Other fatigue: Secondary | ICD-10-CM | POA: Diagnosis not present

## 2021-01-23 DIAGNOSIS — M064 Inflammatory polyarthropathy: Secondary | ICD-10-CM

## 2021-01-23 DIAGNOSIS — N529 Male erectile dysfunction, unspecified: Secondary | ICD-10-CM

## 2021-01-23 DIAGNOSIS — I10 Essential (primary) hypertension: Secondary | ICD-10-CM

## 2021-01-23 LAB — POCT GLYCOSYLATED HEMOGLOBIN (HGB A1C): Hemoglobin A1C: 10.8 % — AB (ref 4.0–5.6)

## 2021-01-23 MED ORDER — GLIMEPIRIDE 2 MG PO TABS: ORAL_TABLET | ORAL | 2 refills | Status: DC

## 2021-01-23 NOTE — Progress Notes (Unsigned)
[facility] [place], [place] [address]  Internal MEDICINE  Office Visit Note  Patient Name: Matthew Gay  [id number]  [id number]  Date of Service: 01/26/2021  Chief Complaint  Patient presents with  . Diabetes  . Hypertension  . Hyperlipidemia  . Quality Metric Gaps    Eye exam    HPI Pt is here for f/u after missing last appt. -He has been taking [place]. At home BG 150-160 in AM which is his end of day since he works nights, does not check fasting. -Takes 837m ibuprofen only 1-2 times per week for pain. Will try OTC strength.  -Taking Zestoretic and is doing well. Does not regularly check BP at home. -Needs eye exam appt--he will call to set up.  Current Medication: Outpatient Encounter Medications as of 01/23/2021  Medication Sig  . atorvastatin (LIPITOR) 20 MG tablet Take 1 tablet(s) by mouth at bedtime for cholesterol.  . Blood Glucose Monitoring Suppl (ONE TOUCH ULTRA MINI) w/Device KIT Use as directed diagnosis e11.65  . FARXIGA 10 MG TABS tablet TAKE 1 TABLET BY MOUTH DAILY BEFORE BREAKFAST.  .Marland Kitchengabapentin (NEURONTIN) 100 MG capsule TAKE ONE CAPSULE BY MOUTH TWICE A DAY FOR DIABETIC NEUROPATHY  . glimepiride (AMARYL) 2 MG tablet Take once per day with biggest meal of the day.  .Marland Kitchenglucose blood test strip 1 each by Other route daily. Check blood sugars once daily and as needed.  E11.65  . HYDROcodone-acetaminophen (NORCO) 5-325 MG tablet Take 1 tablet by mouth every 6 (six) hours as needed for moderate pain.  .Marland Kitchenibuprofen (ADVIL) 800 MG tablet Take 1 tablet (800 mg total) by mouth every 8 (eight) hours as needed for moderate pain.  .Marland Kitchenlisinopril-hydrochlorothiazide (ZESTORETIC) 20-12.5 MG tablet Take One PO QD for blood pressure,  . tadalafil (CIALIS) 20 MG tablet 1 tab 1 hour prior to intercourse  . tizanidine (ZANAFLEX) 2 MG capsule Take 2 mg by mouth 3 (three) times daily.  . traZODone (DESYREL) 50 MG tablet Take 0.5-1 tablets (25-50 mg total)  by mouth at bedtime as needed for sleep.   No facility-administered encounter medications on file as of 01/23/2021.    Surgical History: Past Surgical History:  Procedure Laterality Date  . colon cancer    . COLON SURGERY      Medical History: Past Medical History:  Diagnosis Date  . Diabetes mellitus without complication ([place]   . Hyperlipemia   . Hypertension     Family History: Family History  Family history unknown: Yes    Social History   Socioeconomic History  . Marital status: Married    Spouse name: Not on file  . Number of children: Not on file  . Years of education: Not on file  . Highest education level: Not on file  Occupational History  . Not on file  Tobacco Use  . Smoking status: Current Some Day Smoker    Types: Cigarettes  . Smokeless tobacco: Never Used  . Tobacco comment: 3 a day  Vaping Use  . Vaping Use: Never used  Substance and Sexual Activity  . Alcohol use: Yes    Comment: Rarely  . Drug use: No  . Sexual activity: Not on file  Other Topics Concern  . Not on file  Social History Narrative  . Not on file   Social Determinants of Health   Financial Resource Strain: Not on file  Food Insecurity: Not on file  Transportation Needs: Not on file  Physical  Activity: Not on file  Stress: Not on file  Social Connections: Not on file  Intimate Partner Violence: Not on file      Review of Systems  Constitutional: Negative for chills, fatigue and unexpected weight change.  HENT: Negative for congestion, postnasal drip, rhinorrhea, sneezing and sore throat.   Eyes: Negative for redness.  Respiratory: Negative for cough, chest tightness and shortness of breath.   Cardiovascular: Negative for chest pain and palpitations.  Gastrointestinal: Negative for abdominal pain, constipation, diarrhea, nausea and vomiting.  Genitourinary: Negative for dysuria and frequency.  Musculoskeletal: Positive for arthralgias. Negative for back pain, joint  swelling and neck pain.  Skin: Negative for rash.  Neurological: Negative.  Negative for tremors and numbness.  Hematological: Negative for adenopathy. Does not bruise/bleed easily.  Psychiatric/Behavioral: Negative for behavioral problems (Depression), sleep disturbance and suicidal ideas. The patient is not nervous/anxious.     Vital Signs: BP 122/82   Pulse 68   Temp 97.8 F (36.6 C)   Resp 16   Ht '5\' 8"'  (1.727 m)   Wt 172 lb (78 kg)   SpO2 97%   BMI 26.15 kg/m    Physical Exam Vitals and nursing note reviewed.  Constitutional:      General: He is not in acute distress.    Appearance: He is well-developed. He is not diaphoretic.  HENT:     Head: Normocephalic and atraumatic.     Mouth/Throat:     Pharynx: No oropharyngeal exudate.  Eyes:     Pupils: Pupils are equal, round, and reactive to light.  Neck:     Thyroid: No thyromegaly.     Vascular: No JVD.     Trachea: No tracheal deviation.  Cardiovascular:     Rate and Rhythm: Normal rate and regular rhythm.     Heart sounds: Normal heart sounds. No murmur heard. No friction rub. No gallop.   Pulmonary:     Effort: Pulmonary effort is normal. No respiratory distress.     Breath sounds: No wheezing or rales.  Chest:     Chest wall: No tenderness.  Abdominal:     General: Bowel sounds are normal.     Palpations: Abdomen is soft.  Musculoskeletal:        General: Normal range of motion.     Cervical back: Normal range of motion and neck supple.  Lymphadenopathy:     Cervical: No cervical adenopathy.  Skin:    General: Skin is warm and dry.  Neurological:     Mental Status: He is alert and oriented to person, place, and time.     Cranial Nerves: No cranial nerve deficit.  Psychiatric:        Behavior: Behavior normal.        Thought Content: Thought content normal.        Judgment: Judgment normal.        Assessment/Plan: 1. Uncontrolled type 2 diabetes mellitus with hyperglycemia (HCC) - POCT HgB  A1C is 10.8. Will start on Glimepiride with biggest meal of the day in addition to Continuing the [place]. Pt will log BG including fasting numbers to bring next visit. Educated pt that he needs to work on diet and exercise and that is his numbers aren't improving he will need to begin insulin. Pt will also schedule his eye exam. - glimepiride (AMARYL) 2 MG tablet; Take once per day with biggest meal of the day.  Dispense: 30 tablet; Refill: 2  2. Essential hypertension BP well controlled,  continue Zestoretic.  3. Inflammatory polyarthropathy (HCC) Pt will try taking lower ibuprofen OTC strength to manage pain as needed.  4. Erectile dysfunction, unspecified erectile dysfunction type Followed by urology.  5. Other fatigue - Lipid panel - CBC With Differential - TSH + free T4 - COMPLETE METABOLIC PANEL WITH GFR   General Counseling: Quantay verbalizes understanding of the findings of todays visit and agrees with plan of treatment. I have discussed any further diagnostic evaluation that may be needed or ordered today. We also reviewed his medications today. he has been encouraged to call the office with any questions or concerns that should arise related to todays visit.    Orders Placed This Encounter  Procedures  . Lipid panel  . CBC With Differential  . TSH + free T4  . COMPLETE METABOLIC PANEL WITH GFR  . POCT HgB A1C    Meds ordered this encounter  Medications  . glimepiride (AMARYL) 2 MG tablet    Sig: Take once per day with biggest meal of the day.    Dispense:  30 tablet    Refill:  2    This patient was seen by Drema Dallas, PA-C in collaboration with Dr. Clayborn Bigness as a part of collaborative care agreement.   Total time spent:30 Minutes Time spent includes review of chart, medications, test results, and follow up plan with the patient.      Dr Lavera Guise Internal medicine

## 2021-01-30 ENCOUNTER — Other Ambulatory Visit: Payer: Self-pay | Admitting: Hospice and Palliative Medicine

## 2021-01-30 DIAGNOSIS — I10 Essential (primary) hypertension: Secondary | ICD-10-CM

## 2021-01-31 ENCOUNTER — Other Ambulatory Visit: Payer: Self-pay | Admitting: Internal Medicine

## 2021-02-20 ENCOUNTER — Ambulatory Visit (INDEPENDENT_AMBULATORY_CARE_PROVIDER_SITE_OTHER): Payer: 59 | Admitting: Physician Assistant

## 2021-02-20 ENCOUNTER — Encounter: Payer: Self-pay | Admitting: Physician Assistant

## 2021-02-20 DIAGNOSIS — E1165 Type 2 diabetes mellitus with hyperglycemia: Secondary | ICD-10-CM

## 2021-02-20 DIAGNOSIS — Z9111 Patient's noncompliance with dietary regimen: Secondary | ICD-10-CM

## 2021-02-20 DIAGNOSIS — I10 Essential (primary) hypertension: Secondary | ICD-10-CM

## 2021-02-20 DIAGNOSIS — Z91199 Patient's noncompliance with other medical treatment and regimen due to unspecified reason: Secondary | ICD-10-CM

## 2021-02-20 NOTE — Progress Notes (Signed)
[facility] [address] [place], [place] [address]  Internal MEDICINE  Office Visit Note  Patient Name: Matthew Gay  [id number]  [id number]  Date of Service: 02/26/2021  Chief Complaint  Patient presents with  . Follow-up  . Hyperlipidemia  . Hypertension  . Diabetes  . Quality Metric Gaps    Colonoscopy, eye exam scheduled, foot exam    HPI Pt is here for f/u on HTN and DM. -He did not bring log today but has had some high and low readings that he cannot recall. Does not check any fasting readings. Discussed the importance of checking BG regularly and loging numbers. He also never filled the glimepiride added last visit and has only been taking the [place] still. Discussed again the importance or adhering to medications and the need for additional medication for treatment. He will go fill this now and continue to work on diet and exercise as well. -Eye exam scheduled.  -Last colonoscopy in 2008--wants to hold off on GI referral for this until next visit -Pt also never went to have labwork done. He will go now -BP stable today   Current Medication: Outpatient Encounter Medications as of 02/20/2021  Medication Sig  . atorvastatin (LIPITOR) 20 MG tablet Take 1 tablet(s) by mouth at bedtime for cholesterol.  . Blood Glucose Monitoring Suppl (ONE TOUCH ULTRA MINI) w/Device KIT Use as directed diagnosis e11.65  . FARXIGA 10 MG TABS tablet TAKE 1 TABLET BY MOUTH EVERY DAY BEFORE BREAKFAST  . gabapentin (NEURONTIN) 100 MG capsule TAKE ONE CAPSULE BY MOUTH TWICE A DAY FOR DIABETIC NEUROPATHY  . glimepiride (AMARYL) 2 MG tablet Take once per day with biggest meal of the day.  Marland Kitchen glucose blood test strip 1 each by Other route daily. Check blood sugars once daily and as needed.  E11.65  . HYDROcodone-acetaminophen (NORCO) 5-325 MG tablet Take 1 tablet by mouth every 6 (six) hours as needed for moderate pain.  Marland Kitchen ibuprofen (ADVIL) 800 MG tablet Take 1 tablet (800 mg total) by  mouth every 8 (eight) hours as needed for moderate pain.  Marland Kitchen lisinopril-hydrochlorothiazide (ZESTORETIC) 20-12.5 MG tablet TAKE 1 TABLET BY MOUTH EVERY DAY FOR BLOOD PRESSURE  . tadalafil (CIALIS) 20 MG tablet 1 tab 1 hour prior to intercourse  . tizanidine (ZANAFLEX) 2 MG capsule Take 2 mg by mouth 3 (three) times daily.  . traZODone (DESYREL) 50 MG tablet Take 0.5-1 tablets (25-50 mg total) by mouth at bedtime as needed for sleep.   No facility-administered encounter medications on file as of 02/20/2021.    Surgical History: Past Surgical History:  Procedure Laterality Date  . colon cancer    . COLON SURGERY      Medical History: Past Medical History:  Diagnosis Date  . Diabetes mellitus without complication ([place])   . Hyperlipemia   . Hypertension     Family History: Family History  Family history unknown: Yes    Social History   Socioeconomic History  . Marital status: Married    Spouse name: Not on file  . Number of children: Not on file  . Years of education: Not on file  . Highest education level: Not on file  Occupational History  . Not on file  Tobacco Use  . Smoking status: Current Some Day Smoker    Types: Cigarettes  . Smokeless tobacco: Never Used  . Tobacco comment: 3 a day  Vaping Use  . Vaping Use: Never used  Substance and Sexual Activity  .  Alcohol use: Yes    Comment: Rarely  . Drug use: No  . Sexual activity: Not on file  Other Topics Concern  . Not on file  Social History Narrative  . Not on file   Social Determinants of Health   Financial Resource Strain: Not on file  Food Insecurity: Not on file  Transportation Needs: Not on file  Physical Activity: Not on file  Stress: Not on file  Social Connections: Not on file  Intimate Partner Violence: Not on file      Review of Systems  Constitutional: Negative for chills, fatigue and unexpected weight change.  HENT: Negative for congestion, postnasal drip, rhinorrhea, sneezing and sore  throat.   Eyes: Negative for redness.  Respiratory: Negative for cough, chest tightness and shortness of breath.   Cardiovascular: Negative for chest pain and palpitations.  Gastrointestinal: Negative for abdominal pain, constipation, diarrhea, nausea and vomiting.  Genitourinary: Negative for dysuria and frequency.  Musculoskeletal: Negative for arthralgias, back pain, joint swelling and neck pain.  Skin: Negative for rash.  Neurological: Negative.  Negative for tremors and numbness.  Hematological: Negative for adenopathy. Does not bruise/bleed easily.  Psychiatric/Behavioral: Positive for sleep disturbance. Negative for behavioral problems (Depression) and suicidal ideas. The patient is not nervous/anxious.     Vital Signs: BP 136/86   Pulse 70   Temp (!) 97.3 F (36.3 C)   Resp 16   Ht _0  (1.727 m)   Wt 173 lb 6.4 oz (78.7 kg)   SpO2 99%   BMI 26.37 kg/m    Physical Exam Vitals and nursing note reviewed.  Constitutional:      General: He is not in acute distress.    Appearance: He is well-developed. He is not diaphoretic.  HENT:     Head: Normocephalic and atraumatic.     Mouth/Throat:     Pharynx: No oropharyngeal exudate.  Eyes:     Pupils: Pupils are equal, round, and reactive to light.  Neck:     Thyroid: No thyromegaly.     Vascular: No JVD.     Trachea: No tracheal deviation.  Cardiovascular:     Rate and Rhythm: Normal rate and regular rhythm.     Heart sounds: Normal heart sounds. No murmur heard. No friction rub. No gallop.   Pulmonary:     Effort: Pulmonary effort is normal. No respiratory distress.     Breath sounds: No wheezing or rales.  Chest:     Chest wall: No tenderness.  Abdominal:     General: Bowel sounds are normal.     Palpations: Abdomen is soft.  Musculoskeletal:        General: Normal range of motion.     Cervical back: Normal range of motion and neck supple.  Lymphadenopathy:     Cervical: No cervical adenopathy.  Skin:     General: Skin is warm and dry.  Neurological:     Mental Status: He is alert and oriented to person, place, and time.     Cranial Nerves: No cranial nerve deficit.  Psychiatric:        Behavior: Behavior normal.        Thought Content: Thought content normal.        Judgment: Judgment normal.        Assessment/Plan: 1. Uncontrolled type 2 diabetes mellitus with hyperglycemia ([place]) Pt will need to log BG at home including fasting numbers and bring log next visit. Pt also needs to start glimepiride prescribed last  visit in addition to continuing [place]. He will need repeat A1c next visit. Pt should also work on improving diet and exercise.  2. Essential hypertension Stable, continue Zestoretic  3. Noncompliance with therapeutic plan Pt counseled on importance of adhering to medication and treatment plan. Pt understood and will fill new script and log BG numbers. Pt will also go to have his routine fasting labs done.   General Counseling: Kedar verbalizes understanding of the findings of todays visit and agrees with plan of treatment. I have discussed any further diagnostic evaluation that may be needed or ordered today. We also reviewed his medications today. he has been encouraged to call the office with any questions or concerns that should arise related to todays visit.    No orders of the defined types were placed in this encounter.   No orders of the defined types were placed in this encounter.   This patient was seen by Drema Dallas, PA-C in collaboration with Dr. Clayborn Bigness as a part of collaborative care agreement.   Total time spent:30 Minutes Time spent includes review of chart, medications, test results, and follow up plan with the patient.      Dr Lavera Guise Internal medicine

## 2021-02-28 ENCOUNTER — Other Ambulatory Visit: Payer: Self-pay | Admitting: Physician Assistant

## 2021-02-28 DIAGNOSIS — I10 Essential (primary) hypertension: Secondary | ICD-10-CM

## 2021-03-04 ENCOUNTER — Other Ambulatory Visit: Payer: Self-pay | Admitting: Physician Assistant

## 2021-04-24 ENCOUNTER — Other Ambulatory Visit: Payer: Self-pay

## 2021-04-24 ENCOUNTER — Encounter: Payer: Self-pay | Admitting: Physician Assistant

## 2021-04-24 ENCOUNTER — Ambulatory Visit (INDEPENDENT_AMBULATORY_CARE_PROVIDER_SITE_OTHER): Payer: 59 | Admitting: Physician Assistant

## 2021-04-24 DIAGNOSIS — Z125 Encounter for screening for malignant neoplasm of prostate: Secondary | ICD-10-CM | POA: Diagnosis not present

## 2021-04-24 DIAGNOSIS — F17219 Nicotine dependence, cigarettes, with unspecified nicotine-induced disorders: Secondary | ICD-10-CM | POA: Diagnosis not present

## 2021-04-24 DIAGNOSIS — Z1212 Encounter for screening for malignant neoplasm of rectum: Secondary | ICD-10-CM | POA: Diagnosis not present

## 2021-04-24 DIAGNOSIS — Z1211 Encounter for screening for malignant neoplasm of colon: Secondary | ICD-10-CM

## 2021-04-24 DIAGNOSIS — Z0001 Encounter for general adult medical examination with abnormal findings: Secondary | ICD-10-CM | POA: Diagnosis not present

## 2021-04-24 DIAGNOSIS — E1165 Type 2 diabetes mellitus with hyperglycemia: Secondary | ICD-10-CM

## 2021-04-24 DIAGNOSIS — R69 Illness, unspecified: Secondary | ICD-10-CM | POA: Diagnosis not present

## 2021-04-24 DIAGNOSIS — E782 Mixed hyperlipidemia: Secondary | ICD-10-CM | POA: Diagnosis not present

## 2021-04-24 DIAGNOSIS — I10 Essential (primary) hypertension: Secondary | ICD-10-CM | POA: Diagnosis not present

## 2021-04-24 DIAGNOSIS — R5383 Other fatigue: Secondary | ICD-10-CM

## 2021-04-24 DIAGNOSIS — R300 Dysuria: Secondary | ICD-10-CM

## 2021-04-24 LAB — POCT GLYCOSYLATED HEMOGLOBIN (HGB A1C): Hemoglobin A1C: 8.4 % — AB (ref 4.0–5.6)

## 2021-04-24 MED ORDER — GLIMEPIRIDE 2 MG PO TABS
ORAL_TABLET | ORAL | 2 refills | Status: DC
Start: 2021-04-24 — End: 2021-08-25

## 2021-04-24 MED ORDER — DAPAGLIFLOZIN PROPANEDIOL 10 MG PO TABS: ORAL_TABLET | ORAL | 1 refills | Status: DC

## 2021-04-24 NOTE — Progress Notes (Signed)
[facility] [place], [place] [address]  Internal MEDICINE  Office Visit Note  Patient Name: Matthew Gay  [id number]  [id number]  Date of Service: 04/24/2021  Chief Complaint  Patient presents with   Annual Exam    Discuss meds   Diabetes   Hyperlipidemia   Hypertension   Quality Metric Gaps    Pneumovax, shingrix, colonoscopy, eye exam      HPI Pt is here for routine health maintenance examination -Fasting numbers 140-160. Forgot lab work again bc he works third shift and is hard to find a time to go when he hasn't eaten. States he thinks he can go today. He taking both [place] and Glimepiride now, though he only started th glimepiride after last visit. -eye exam scheduled for October -2-3cigs per night at work -Continues to take atorvastatin -Does not check BP at home -States his last colonoscopy was in 2008, will repeat this now, especially since he does mention some wt loss/inability to put wt on--wants to be 180lbs (wt has been stable over last few visits in office)  Current Medication: Outpatient Encounter Medications as of 04/24/2021  Medication Sig   atorvastatin (LIPITOR) 20 MG tablet Take 1 tablet(s) by mouth at bedtime for cholesterol.   Blood Glucose Monitoring Suppl (ONE TOUCH ULTRA MINI) w/Device KIT Use as directed diagnosis e11.65   dapagliflozin propanediol (FARXIGA) 10 MG TABS tablet TAKE 1 TABLET BY MOUTH EVERY DAY BEFORE BREAKFAST   gabapentin (NEURONTIN) 100 MG capsule TAKE ONE CAPSULE BY MOUTH TWICE A DAY FOR DIABETIC NEUROPATHY   glimepiride (AMARYL) 2 MG tablet Take once per day with biggest meal of the day.   glucose blood test strip 1 each by Other route daily. Check blood sugars once daily and as needed.  E11.65   HYDROcodone-acetaminophen (NORCO) 5-325 MG tablet Take 1 tablet by mouth every 6 (six) hours as needed for moderate pain.   ibuprofen (ADVIL) 800 MG tablet Take 1 tablet (800 mg total) by mouth every 8 (eight)  hours as needed for moderate pain.   lisinopril-hydrochlorothiazide (ZESTORETIC) 20-12.5 MG tablet TAKE 1 TABLET BY MOUTH EVERY DAY FOR BLOOD PRESSURE   tadalafil (CIALIS) 20 MG tablet 1 tab 1 hour prior to intercourse   tizanidine (ZANAFLEX) 2 MG capsule Take 2 mg by mouth 3 (three) times daily.   traZODone (DESYREL) 50 MG tablet Take 0.5-1 tablets (25-50 mg total) by mouth at bedtime as needed for sleep.   [DISCONTINUED] FARXIGA 10 MG TABS tablet TAKE 1 TABLET BY MOUTH EVERY DAY BEFORE BREAKFAST   [DISCONTINUED] glimepiride (AMARYL) 2 MG tablet Take once per day with biggest meal of the day.   No facility-administered encounter medications on file as of 04/24/2021.    Surgical History: Past Surgical History:  Procedure Laterality Date   colon cancer     COLON SURGERY      Medical History: Past Medical History:  Diagnosis Date   Diabetes mellitus without complication ([place])    Hyperlipemia    Hypertension     Family History: Family History  Family history unknown: Yes      Review of Systems  Constitutional:  Negative for chills, fatigue and unexpected weight change.  HENT:  Negative for congestion, postnasal drip, rhinorrhea, sneezing and sore throat.   Eyes:  Negative for redness.  Respiratory:  Negative for cough, chest tightness and shortness of breath.   Cardiovascular:  Negative for chest pain and palpitations.  Gastrointestinal:  Negative for abdominal pain, constipation,  diarrhea, nausea and vomiting.  Genitourinary:  Negative for dysuria and frequency.  Musculoskeletal:  Negative for arthralgias, back pain, joint swelling and neck pain.  Skin:  Negative for rash.  Neurological: Negative.  Negative for tremors and numbness.  Hematological:  Negative for adenopathy. Does not bruise/bleed easily.  Psychiatric/Behavioral:  Negative for behavioral problems (Depression), sleep disturbance and suicidal ideas. The patient is not nervous/anxious.     Vital Signs: BP (!)  144/76   Pulse 70   Temp (!) 97.4 F (36.3 C)   Resp 16   Ht 5' 8.5" (1.74 m)   Wt 173 lb 6.4 oz (78.7 kg)   SpO2 99%   BMI 25.98 kg/m    Physical Exam Vitals and nursing note reviewed.  Constitutional:      General: He is not in acute distress.    Appearance: He is well-developed and normal weight. He is not diaphoretic.  HENT:     Head: Normocephalic and atraumatic.     Right Ear: External ear normal.     Left Ear: External ear normal.     Nose: Nose normal.     Mouth/Throat:     Pharynx: No oropharyngeal exudate.  Eyes:     General: No scleral icterus.       Right eye: No discharge.        Left eye: No discharge.     Conjunctiva/sclera: Conjunctivae normal.     Pupils: Pupils are equal, round, and reactive to light.  Neck:     Thyroid: No thyromegaly.     Vascular: No JVD.     Trachea: No tracheal deviation.  Cardiovascular:     Rate and Rhythm: Normal rate and regular rhythm.     Pulses:          Dorsalis pedis pulses are 2+ on the right side and 2+ on the left side.     Heart sounds: Normal heart sounds. No murmur heard.   No friction rub. No gallop.  Pulmonary:     Effort: Pulmonary effort is normal. No respiratory distress.     Breath sounds: Normal breath sounds. No stridor. No wheezing or rales.  Chest:     Chest wall: No tenderness.  Abdominal:     General: Bowel sounds are normal. There is no distension.     Palpations: Abdomen is soft. There is no mass.     Tenderness: There is no abdominal tenderness. There is no guarding or rebound.  Musculoskeletal:        General: No tenderness or deformity. Normal range of motion.     Cervical back: Normal range of motion and neck supple.  Feet:     Right foot:     Protective Sensation: 2 sites tested.  2 sites sensed.     Skin integrity: Skin integrity normal.     Left foot:     Protective Sensation: 2 sites tested.  2 sites sensed.     Skin integrity: Skin integrity normal.  Lymphadenopathy:     Cervical:  No cervical adenopathy.  Skin:    General: Skin is warm and dry.     Coloration: Skin is not pale.     Findings: No erythema or rash.  Neurological:     Mental Status: He is alert.     Cranial Nerves: No cranial nerve deficit.     Motor: No abnormal muscle tone.     Coordination: Coordination normal.     Deep Tendon Reflexes: Reflexes are normal  and symmetric.  Psychiatric:        Behavior: Behavior normal.        Thought Content: Thought content normal.        Judgment: Judgment normal.     LABS: Recent Results (from the past 2160 hour(s))  POCT glycosylated hemoglobin (Hb A1C)     Status: Abnormal   Collection Time: 04/24/21  2:35 PM  Result Value Ref Range   Hemoglobin A1C 8.4 (A) 4.0 - 5.6 %   HbA1c POC (<> result, manual entry)     HbA1c, POC (prediabetic range)     HbA1c, POC (controlled diabetic range)          Assessment/Plan: 1. Encounter for general adult medical examination with abnormal findings Pt did not have labs done and will have these done now. Will also update colonoscopy  2. Uncontrolled type 2 diabetes mellitus with hyperglycemia (HCC) - POCT glycosylated hemoglobin (Hb A1C) is 8.4 today which is a significant improvement from 10.8 last visit. Will continue farxiga and glimepiride and monitor BG at home. Pt will work on improving diet and exercise. - glimepiride (AMARYL) 2 MG tablet; Take once per day with biggest meal of the day.  Dispense: 30 tablet; Refill: 2 - dapagliflozin propanediol (FARXIGA) 10 MG TABS tablet; TAKE 1 TABLET BY MOUTH EVERY DAY BEFORE BREAKFAST  Dispense: 30 tablet; Refill: 1  3. Essential hypertension Stable, continue zestoretic  4. Mixed hyperlipidemia Continue atorvastatin, will update labs - Lipid Panel With LDL/HDL Ratio  5. Cigarette nicotine dependence with nicotine-induced disorder Has significantly cut down and is continuing to work on quitting Smoking cessation counseling: Pt acknowledges the risks of long term  smoking, she will try to quite smoking. Options for different medications including nicotine products, chewing gum, patch etc, Wellbutrin and Chantix is discussed Goal and date of compete cessation is discussed Total time spent in smoking cessation is 10 min.   6. Screening for colorectal cancer - Ambulatory referral to Gastroenterology  7. Prostate cancer screening - PSA Total (Reflex To Free)  8. Other fatigue - CBC w/Diff/Platelet - Comprehensive metabolic panel - Lipid Panel With LDL/HDL Ratio - TSH + free T4 - PSA Total (Reflex To Free)  9. Dysuria - UA/M w/rflx Culture, Routine   General Counseling: Estevon verbalizes understanding of the findings of todays visit and agrees with plan of treatment. I have discussed any further diagnostic evaluation that may be needed or ordered today. We also reviewed his medications today. he has been encouraged to call the office with any questions or concerns that should arise related to todays visit.    Counseling:    Orders Placed This Encounter  Procedures   UA/M w/rflx Culture, Routine   CBC w/Diff/Platelet   Comprehensive metabolic panel   Lipid Panel With LDL/HDL Ratio   TSH + free T4   PSA Total (Reflex To Free)   Ambulatory referral to Gastroenterology   POCT glycosylated hemoglobin (Hb A1C)    Meds ordered this encounter  Medications   glimepiride (AMARYL) 2 MG tablet    Sig: Take once per day with biggest meal of the day.    Dispense:  30 tablet    Refill:  2   dapagliflozin propanediol (FARXIGA) 10 MG TABS tablet    Sig: TAKE 1 TABLET BY MOUTH EVERY DAY BEFORE BREAKFAST    Dispense:  30 tablet    Refill:  1    This patient was seen by Drema Dallas, PA-C in collaboration with Dr. Latricia Heft  Humphrey Rolls as a part of collaborative care agreement.  Total time spent:40 Minutes  Time spent includes review of chart, medications, test results, and follow up plan with the patient.     Lavera Guise, MD  Internal  Medicine

## 2021-04-25 LAB — CBC WITH DIFFERENTIAL/PLATELET
Basophils Absolute: 0.1 10*3/uL (ref 0.0–0.2)
Basos: 1 %
EOS (ABSOLUTE): 0.3 10*3/uL (ref 0.0–0.4)
Eos: 3 %
Hematocrit: 44.1 % (ref 37.5–51.0)
Hemoglobin: 14.7 g/dL (ref 13.0–17.7)
Immature Grans (Abs): 0 10*3/uL (ref 0.0–0.1)
Immature Granulocytes: 0 %
Lymphocytes Absolute: 1.9 10*3/uL (ref 0.7–3.1)
Lymphs: 22 %
MCH: 28.6 pg (ref 26.6–33.0)
MCHC: 33.3 g/dL (ref 31.5–35.7)
MCV: 86 fL (ref 79–97)
Monocytes Absolute: 0.6 10*3/uL (ref 0.1–0.9)
Monocytes: 7 %
Neutrophils Absolute: 5.6 10*3/uL (ref 1.4–7.0)
Neutrophils: 67 %
Platelets: 253 10*3/uL (ref 150–450)
RBC: 5.14 x10E6/uL (ref 4.14–5.80)
RDW: 13.1 % (ref 11.6–15.4)
WBC: 8.5 10*3/uL (ref 3.4–10.8)

## 2021-04-25 LAB — COMPREHENSIVE METABOLIC PANEL
ALT: 22 IU/L (ref 0–44)
AST: 13 IU/L (ref 0–40)
Albumin/Globulin Ratio: 1.4 (ref 1.2–2.2)
Albumin: 4.5 g/dL (ref 3.8–4.8)
Alkaline Phosphatase: 93 IU/L (ref 44–121)
BUN/Creatinine Ratio: 15 (ref 10–24)
BUN: 19 mg/dL (ref 8–27)
Bilirubin Total: 0.3 mg/dL (ref 0.0–1.2)
CO2: 23 mmol/L (ref 20–29)
Calcium: 10.3 mg/dL — ABNORMAL HIGH (ref 8.6–10.2)
Chloride: 99 mmol/L (ref 96–106)
Creatinine, Ser: 1.25 mg/dL (ref 0.76–1.27)
Globulin, Total: 3.3 g/dL (ref 1.5–4.5)
Glucose: 175 mg/dL — ABNORMAL HIGH (ref 65–99)
Potassium: 4.4 mmol/L (ref 3.5–5.2)
Sodium: 138 mmol/L (ref 134–144)
Total Protein: 7.8 g/dL (ref 6.0–8.5)
eGFR: 62 mL/min/{1.73_m2} (ref >59)

## 2021-04-25 LAB — MICROSCOPIC EXAMINATION
Bacteria, UA: NONE SEEN
Casts: NONE SEEN /lpf
Epithelial Cells (non renal): NONE SEEN /hpf (ref 0–10)
RBC, Urine: NONE SEEN /hpf (ref 0–2)
WBC, UA: NONE SEEN /hpf (ref 0–5)

## 2021-04-25 LAB — UA/M W/RFLX CULTURE, ROUTINE
Bilirubin, UA: NEGATIVE
Ketones, UA: NEGATIVE
Leukocytes,UA: NEGATIVE
Nitrite, UA: NEGATIVE
Protein,UA: NEGATIVE
RBC, UA: NEGATIVE
Specific Gravity, UA: 1.026 (ref 1.005–1.030)
Urobilinogen, Ur: 0.2 mg/dL (ref 0.2–1.0)
pH, UA: 6 (ref 5.0–7.5)

## 2021-04-25 LAB — LIPID PANEL WITH LDL/HDL RATIO
Cholesterol, Total: 242 mg/dL — ABNORMAL HIGH (ref 100–199)
HDL: 55 mg/dL (ref >39)
LDL Chol Calc (NIH): 175 mg/dL — ABNORMAL HIGH (ref 0–99)
LDL/HDL Ratio: 3.2 ratio (ref 0.0–3.6)
Triglycerides: 71 mg/dL (ref 0–149)
VLDL Cholesterol Cal: 12 mg/dL (ref 5–40)

## 2021-04-25 LAB — TSH+FREE T4
Free T4: 1.23 ng/dL (ref 0.82–1.77)
TSH: 1.79 u[IU]/mL (ref 0.450–4.500)

## 2021-04-25 LAB — PSA TOTAL (REFLEX TO FREE): Prostate Specific Ag, Serum: 0.5 ng/mL (ref 0.0–4.0)

## 2021-05-16 ENCOUNTER — Telehealth: Payer: Self-pay

## 2021-05-16 NOTE — Telephone Encounter (Signed)
Referral faxed to [facility] at [phone]. Toni Amend

## 2021-05-29 ENCOUNTER — Other Ambulatory Visit: Payer: Self-pay | Admitting: Internal Medicine

## 2021-05-29 DIAGNOSIS — I10 Essential (primary) hypertension: Secondary | ICD-10-CM

## 2021-07-09 ENCOUNTER — Other Ambulatory Visit: Payer: Self-pay

## 2021-07-09 ENCOUNTER — Emergency Department
Admission: EM | Admit: 2021-07-09 | Discharge: 2021-07-09 | Disposition: A | Discharge status: Home / Self Care | Payer: 59 | Attending: Emergency Medicine | Admitting: Emergency Medicine

## 2021-07-09 ENCOUNTER — Emergency Department: Payer: 59

## 2021-07-09 DIAGNOSIS — S92515A Nondisplaced fracture of proximal phalanx of left lesser toe(s), initial encounter for closed fracture: Secondary | ICD-10-CM

## 2021-07-09 DIAGNOSIS — S92422A Displaced fracture of distal phalanx of left great toe, initial encounter for closed fracture: Secondary | ICD-10-CM | POA: Insufficient documentation

## 2021-07-09 DIAGNOSIS — F17210 Nicotine dependence, cigarettes, uncomplicated: Secondary | ICD-10-CM | POA: Insufficient documentation

## 2021-07-09 DIAGNOSIS — Z79899 Other long term (current) drug therapy: Secondary | ICD-10-CM | POA: Diagnosis not present

## 2021-07-09 DIAGNOSIS — M79672 Pain in left foot: Secondary | ICD-10-CM | POA: Diagnosis not present

## 2021-07-09 DIAGNOSIS — S92412A Displaced fracture of proximal phalanx of left great toe, initial encounter for closed fracture: Secondary | ICD-10-CM | POA: Diagnosis not present

## 2021-07-09 DIAGNOSIS — E119 Type 2 diabetes mellitus without complications: Secondary | ICD-10-CM | POA: Diagnosis not present

## 2021-07-09 DIAGNOSIS — R69 Illness, unspecified: Secondary | ICD-10-CM | POA: Diagnosis not present

## 2021-07-09 DIAGNOSIS — S90932A Unspecified superficial injury of left great toe, initial encounter: Secondary | ICD-10-CM | POA: Diagnosis present

## 2021-07-09 DIAGNOSIS — W010XXA Fall on same level from slipping, tripping and stumbling without subsequent striking against object, initial encounter: Secondary | ICD-10-CM | POA: Insufficient documentation

## 2021-07-09 DIAGNOSIS — I10 Essential (primary) hypertension: Secondary | ICD-10-CM | POA: Insufficient documentation

## 2021-07-09 DIAGNOSIS — S92412B Displaced fracture of proximal phalanx of left great toe, initial encounter for open fracture: Secondary | ICD-10-CM | POA: Diagnosis not present

## 2021-07-09 MED ORDER — SULFAMETHOXAZOLE-TRIMETHOPRIM 800-160 MG PO TABS
1.0000 | ORAL_TABLET | Freq: Once | ORAL | Status: AC
  Administered 2021-07-09: 1 via ORAL
  Filled 2021-07-09: qty 1

## 2021-07-09 MED ORDER — SULFAMETHOXAZOLE-TRIMETHOPRIM 800-160 MG PO TABS: 1.0000 | ORAL_TABLET | Freq: Two times a day (BID) | ORAL | 0 refills | Status: DC

## 2021-07-09 MED ORDER — HYDROCODONE-ACETAMINOPHEN 5-325 MG PO TABS: 1.0000 | ORAL_TABLET | Freq: Three times a day (TID) | ORAL | 0 refills | Status: AC | PRN

## 2021-07-09 NOTE — ED Triage Notes (Signed)
[facility] was [facility] from [facility] for injury/Fx to the left great toe, 2nd and 4rd toe FX. States he got tangling up in the dogs chain on Monday. Reported from [facility] the past has open displaced fractures that may require abx and ortho referral.

## 2021-07-09 NOTE — Discharge Instructions (Addendum)
You are being treated for fractures to your first and second toes.  You should wear the postop shoe to walk around until you are evaluated by a foot specialist.  Take the antibiotic as directed.  Keep the wounds clean, dry, and covered.  Call the podiatrist listed above, for office visit next week for reevaluation.  Return to the ED if needed.

## 2021-07-09 NOTE — ED Provider Notes (Signed)
[facility] Emergency Department Provider Note ____________________________________________  Time seen: 1030  I have reviewed the triage vital signs and the nursing notes.  HISTORY  Chief Complaint  Toe Injury   HPI Matthew Gay is a 70 y.o. male with the below medical history including inflammatory polyarthritis, type 2 diabetes, and hypertension, who presents to the ED forearm [facility] urgent care, for evaluation of fractures of the first and second toes.  Patient reports a mechanical injury on Monday, when he got his foot caught in his dog's Tielle chain.  This apparently caused him to fall, injuring the left foot.  Patient was evaluated with x-rays, which confirmed acute oblique fractures to the first and second proximal phalanges, as well as 6 mm of plantar displacement of the first fracture fragment.  No other concerning bony lesions are appreciated.  Patient presents today at the advice of the outside provider, for concern for possible open fracture and need for antibiotic management.  Patient denies any other injury at this time.  Past Medical History:  Diagnosis Date   Diabetes mellitus without complication ([place])    Hyperlipemia    Hypertension     Patient Active Problem List   Diagnosis Date Noted   Inflammatory polyarthropathy ([place]) 07/04/2020   Type 2 diabetes mellitus with hyperglycemia, without long-term current use of insulin ([place]) 10/14/2019   Screening for colon cancer 10/14/2019   Need for vaccination against Streptococcus pneumoniae using pneumococcal conjugate vaccine 13 10/14/2019   Tooth pain 03/04/2019   Erectile dysfunction 03/04/2019   Uncontrolled type 2 diabetes mellitus with hyperglycemia ([place]) 02/16/2018   Primary insomnia 02/16/2018   Hypertension 02/16/2018   Diabetes mellitus without complication ([place]) 15/52/0802    Past Surgical History:  Procedure Laterality Date   colon cancer     COLON SURGERY      Prior to  Admission medications   Medication Sig Start Date End Date Taking? Authorizing Provider  HYDROcodone-acetaminophen (NORCO) 5-325 MG tablet Take 1 tablet by mouth 3 (three) times daily as needed for up to 3 days. 07/09/21 07/12/21 Yes Nolawi Kanady, Dannielle Karvonen, PA-C  sulfamethoxazole-trimethoprim (BACTRIM DS) 800-160 MG tablet Take 1 tablet by mouth 2 (two) times daily. 07/09/21  Yes Emelio Schneller, Dannielle Karvonen, PA-C  atorvastatin (LIPITOR) 20 MG tablet Take 1 tablet(s) by mouth at bedtime for cholesterol. 02/07/20   Kendell Bane, NP  Blood Glucose Monitoring Suppl (ONE TOUCH ULTRA MINI) w/Device KIT Use as directed diagnosis e11.65 03/23/18   Ronnell Freshwater, NP  dapagliflozin propanediol (FARXIGA) 10 MG TABS tablet TAKE 1 TABLET BY MOUTH EVERY DAY BEFORE BREAKFAST 04/24/21   [name], [name], PA-C  gabapentin (NEURONTIN) 100 MG capsule TAKE ONE CAPSULE BY MOUTH TWICE A DAY FOR DIABETIC NEUROPATHY 12/02/17   Ronnell Freshwater, NP  glimepiride (AMARYL) 2 MG tablet Take once per day with biggest meal of the day. 04/24/21   [name], [name], PA-C  glucose blood test strip 1 each by Other route daily. Check blood sugars once daily and as needed.  E11.65 04/17/20   Kendell Bane, NP  ibuprofen (ADVIL) 800 MG tablet Take 1 tablet (800 mg total) by mouth every 8 (eight) hours as needed for moderate pain. 11/06/20   Ronnell Freshwater, NP  lisinopril-hydrochlorothiazide (ZESTORETIC) 20-12.5 MG tablet TAKE 1 TABLET BY MOUTH EVERY DAY FOR BLOOD PRESSURE 05/29/21   Lavera Guise, MD  tadalafil (CIALIS) 20 MG tablet 1 tab 1 hour prior to intercourse 05/02/20  Stoioff, Ronda Fairly, MD  tizanidine (ZANAFLEX) 2 MG capsule Take 2 mg by mouth 3 (three) times daily.    [provider]  traZODone (DESYREL) 50 MG tablet Take 0.5-1 tablets (25-50 mg total) by mouth at bedtime as needed for sleep. 09/07/19   Ronnell Freshwater, NP    Allergies Patient has no known allergies.  Family History  Family history unknown: Yes     Social History Social History   Tobacco Use   Smoking status: Some Days    Types: Cigarettes   Smokeless tobacco: Never   Tobacco comments:    3 a day  Vaping Use   Vaping Use: Never used  Substance Use Topics   Alcohol use: Yes    Comment: Rarely   Drug use: No    Review of Systems  Constitutional: Negative for fever. Cardiovascular: Negative for chest pain. Respiratory: Negative for shortness of breath. Gastrointestinal: Negative for abdominal pain, vomiting and diarrhea. Genitourinary: Negative for dysuria. Musculoskeletal: Negative for back pain.  Left foot injury and toe fractures as above. Skin: Negative for rash. Neurological: Negative for headaches, focal weakness or numbness. ____________________________________________  PHYSICAL EXAM:  VITAL SIGNS: ED Triage Vitals [07/09/21 0936]  Enc Vitals Group     BP (!) 144/80     Pulse Rate 69     Resp 18     Temp 97.6 F (36.4 C)     Temp Source Oral     SpO2 99 %     Weight      Height      Head Circumference      Peak Flow      Pain Score      Pain Loc      Pain Edu?      Excl. in [place]?     Constitutional: Alert and oriented. Well appearing and in no distress. Head: Normocephalic and atraumatic. Eyes: Conjunctivae are normal. Normal extraocular movements Cardiovascular: Normal rate, regular rhythm. Normal distal pulses. Respiratory: Normal respiratory effort. No wheezes/rales/rhonchi. Musculoskeletal: Left foot without obvious deformity or dislocation.  There is some soft tissue swelling to the distal foot at the toes dorsally, as well as some ecchymosis.  There is a fracture blister noted to the medial aspect of the second toe, and a superficial wound with early scab formation to the dorsal aspect of the great toe.  Nontender with normal range of motion in all extremities.  Neurologic:  Normal gait without ataxia. Normal speech and language. No gross focal neurologic deficits are appreciated. Skin:   Skin is warm, dry and intact. No rash noted. Psychiatric: Mood and affect are normal. Patient exhibits appropriate insight and judgment. ____________________________________________    {LABS (pertinent positives/negatives)  ____________________________________________  {EKG  ____________________________________________   RADIOLOGY Official radiology report(s): DG Foot 2 Views Left  Result Date: 07/09/2021 CLINICAL DATA:  First and second toe fractures seen at urgent care. EXAM: LEFT FOOT - 2 VIEW COMPARISON:  None. FINDINGS: Mildly displaced oblique fracture of the distal shaft of the great toe proximal phalanx. The distal fracture fragment is displaced medially approximately 0.4 cm. No angulation. Nondisplaced oblique fracture of the distal shaft of the second toe proximal phalanx. No angulation. Cortical irregularity along the medial aspect of the base of the fourth toe proximal phalanx may represent a minimally displaced avulsion fracture, seen on AP view only. No dislocation. Fusion of the fifth toe mid and distal phalanges is likely congenital. IMPRESSION: 1. Mildly displaced oblique fracture of the distal shaft  of the great toe proximal phalanx. 2. Nondisplaced oblique fracture of the distal shaft of second toe proximal phalanx. 3. Cortical irregularity at the medial aspect of the base of the fourth toe proximal phalanx raises suspicion for minimally displaced avulsion fracture. Electronically Signed   By: Ileana Roup M.D.   On: 07/09/2021 11:24   ____________________________________________  PROCEDURES  Wound care  Procedures ____________________________________________   INITIAL IMPRESSION / ASSESSMENT AND PLAN / ED COURSE  As part of my medical decision making, I reviewed the following data within the [place] reviewed as noted and Notes from prior ED visits    Patient ED evaluation and fracture management of closed first and second phalanx  fractures of the left toe.  Patient presents to the ED for evaluation due to some superficial wounds over the foot and his diabetic status.  No signs of any acute infectious process.  Patient will be treated empirically with Bactrim, as there is no indication of any open fracture, osteomyelitis, or retained foreign body.  Patient's wounds covered and he is replaced in the postop shoe that he previously been provided.  Work is provided for the remainder of this week, prescriptions for the antibiotic as well as pain medicines are provided.  He will follow-up with podiatry for ongoing fracture care.    Matthew Gay was evaluated in Emergency Department on 07/09/2021 for the symptoms described in the history of present illness. He was evaluated in the context of the global COVID-19 pandemic, which necessitated consideration that the patient might be at risk for infection with the SARS-CoV-2 virus that causes COVID-19. Institutional protocols and algorithms that pertain to the evaluation of patients at risk for COVID-19 are in a state of rapid change based on information released by regulatory bodies including the CDC and federal and state organizations. These policies and algorithms were followed during the patient's care in the ED.  I reviewed the patient's prescription history over the last 12 months in the multi-state controlled substances database(s) that includes [place], [place], [place], [place], [place], [place], [place], [place], [place], [place], [place], [place], [place], and [place].  Results were notable for no current RX ____________________________________________  FINAL CLINICAL IMPRESSION(S) / ED DIAGNOSES  Final diagnoses:  Closed displaced fracture of distal phalanx of left great toe, initial encounter  Closed nondisplaced fracture of proximal phalanx of lesser toe of left foot, initial encounter      Melvenia Needles, PA-C 07/09/21  1421    Blake Divine, MD 07/12/21 [phone]

## 2021-07-10 ENCOUNTER — Ambulatory Visit: Payer: 59 | Admitting: Podiatry

## 2021-07-10 DIAGNOSIS — S92912A Unspecified fracture of left toe(s), initial encounter for closed fracture: Secondary | ICD-10-CM

## 2021-07-10 DIAGNOSIS — E1165 Type 2 diabetes mellitus with hyperglycemia: Secondary | ICD-10-CM | POA: Diagnosis not present

## 2021-07-15 NOTE — Progress Notes (Signed)
Subjective:  Patient ID: Matthew Gay, male    DOB: 12/19/1950,  MRN: [id number]  Chief Complaint  Patient presents with   Fracture    Left foot fracture    70 y.o. male presents with the above complaint.  Patient presents with new complaint of left second great toe and fourth digit fracture.  Patient states that he went to urgent care to get evaluated which showed fracture in the left second toe.  Patient states the injury happened 2 days ago on 07/08/2021.  He states it hurts with ambulation.  He states his foot got stuck around the cable and got caught in a car.  He denies any other acute complaints he has not seen any foot and ankle specialist.  He is a diabetic with last A1c of 8.4.  He denies any other acute complaints   Review of Systems: Negative except as noted in the HPI. Denies N/V/F/Ch.  Past Medical History:  Diagnosis Date   Diabetes mellitus without complication (HCC)    Hyperlipemia    Hypertension     Current Outpatient Medications:    atorvastatin (LIPITOR) 20 MG tablet, Take 1 tablet(s) by mouth at bedtime for cholesterol., Disp: 90 tablet, Rfl: 1   Blood Glucose Monitoring Suppl (ONE TOUCH ULTRA MINI) w/Device KIT, Use as directed diagnosis e11.65, Disp: 1 each, Rfl: 0   dapagliflozin propanediol (FARXIGA) 10 MG TABS tablet, TAKE 1 TABLET BY MOUTH EVERY DAY BEFORE BREAKFAST, Disp: 30 tablet, Rfl: 1   gabapentin (NEURONTIN) 100 MG capsule, TAKE ONE CAPSULE BY MOUTH TWICE A DAY FOR DIABETIC NEUROPATHY, Disp: 60 capsule, Rfl: 0   glimepiride (AMARYL) 2 MG tablet, Take once per day with biggest meal of the day., Disp: 30 tablet, Rfl: 2   glucose blood test strip, 1 each by Other route daily. Check blood sugars once daily and as needed.  E11.65, Disp: 100 each, Rfl: 3   ibuprofen (ADVIL) 800 MG tablet, Take 1 tablet (800 mg total) by mouth every 8 (eight) hours as needed for moderate pain., Disp: 30 tablet, Rfl: 3   lisinopril-hydrochlorothiazide (ZESTORETIC) 20-12.5 MG  tablet, TAKE 1 TABLET BY MOUTH EVERY DAY FOR BLOOD PRESSURE, Disp: 90 tablet, Rfl: 1   sulfamethoxazole-trimethoprim (BACTRIM DS) 800-160 MG tablet, Take 1 tablet by mouth 2 (two) times daily., Disp: 20 tablet, Rfl: 0   tadalafil (CIALIS) 20 MG tablet, 1 tab 1 hour prior to intercourse, Disp: 5 tablet, Rfl: 0   tizanidine (ZANAFLEX) 2 MG capsule, Take 2 mg by mouth 3 (three) times daily., Disp: , Rfl:    traZODone (DESYREL) 50 MG tablet, Take 0.5-1 tablets (25-50 mg total) by mouth at bedtime as needed for sleep., Disp: 10 tablet, Rfl: 0  Social History   Tobacco Use  Smoking Status Some Days   Types: Cigarettes  Smokeless Tobacco Never  Tobacco Comments   3 a day    No Known Allergies Objective:  There were no vitals filed for this visit. There is no height or weight on file to calculate BMI. Constitutional Well developed. Well nourished.  Vascular Dorsalis pedis pulses palpable bilaterally. Posterior tibial pulses palpable bilaterally. Capillary refill normal to all digits.  No cyanosis or clubbing noted. Pedal hair growth normal.  Neurologic Normal speech. Oriented to person, place, and time. Epicritic sensation to light touch grossly present bilaterally.  Dermatologic Nails well groomed and normal in appearance. No open wounds. No skin lesions.  Orthopedic: Pain on palpation to the left second digit/great toe/fourth digit.  Pain with range of motion of all of the above digits.  No pain with the metatarsophalangeal joint.  Fracture blister noted.  No signs of purulent drainage noted no redness noted.  No malodor present.   Radiographs: 1. Mildly displaced oblique fracture of the distal shaft of the great toe proximal phalanx. 2. Nondisplaced oblique fracture of the distal shaft of second toe proximal phalanx. 3. Cortical irregularity at the medial aspect of the base of the fourth toe proximal phalanx raises suspicion for minimally displaced avulsion fracture. Assessment:    1. Uncontrolled type 2 diabetes mellitus with hyperglycemia ([place])   2. Fracture of multiple toes, left, closed, initial encounter    Plan:  Patient was evaluated and treated and all questions answered.  Left great toe/second digit/fourth digit minimally displaced fracture with underlying friction blister I -Explained to the patient the etiology of friction blister and various treatment options were discussed.  Given this minimally displaced fractures I believe patient will benefit from surgical shoe and buddy splinting.  I also encouraged him to do Betadine wet-to-dry dressing given that he has fracture blister present.  I also discussed him not to pop the blisters.  He states understanding.  He is a diabetic which leads him to high risk of developing wound complication.  I discussed with him that he could easily lose toes or his foot given the nature of the injury.  He states understanding -Continue using surgical shoe and buddy splinting  No follow-ups on file.

## 2021-07-20 ENCOUNTER — Other Ambulatory Visit: Payer: Self-pay | Admitting: Physician Assistant

## 2021-07-20 DIAGNOSIS — E1165 Type 2 diabetes mellitus with hyperglycemia: Secondary | ICD-10-CM

## 2021-07-28 ENCOUNTER — Ambulatory Visit: Payer: 59 | Admitting: Physician Assistant

## 2021-08-07 ENCOUNTER — Encounter: Payer: Self-pay | Admitting: Podiatry

## 2021-08-07 ENCOUNTER — Ambulatory Visit (INDEPENDENT_AMBULATORY_CARE_PROVIDER_SITE_OTHER): Payer: 59 | Admitting: Podiatry

## 2021-08-07 ENCOUNTER — Ambulatory Visit (INDEPENDENT_AMBULATORY_CARE_PROVIDER_SITE_OTHER): Payer: 59

## 2021-08-07 ENCOUNTER — Other Ambulatory Visit: Payer: Self-pay

## 2021-08-07 DIAGNOSIS — S92912A Unspecified fracture of left toe(s), initial encounter for closed fracture: Secondary | ICD-10-CM

## 2021-08-07 DIAGNOSIS — E1165 Type 2 diabetes mellitus with hyperglycemia: Secondary | ICD-10-CM | POA: Diagnosis not present

## 2021-08-07 MED ORDER — ACETAMINOPHEN-CODEINE #3 300-30 MG PO TABS: 1.0000 | ORAL_TABLET | ORAL | 0 refills | Status: DC | PRN

## 2021-08-07 NOTE — Progress Notes (Signed)
Subjective:  Patient ID: Matthew Gay, male    DOB: Aug 20, 1951,  MRN: [id number]  Chief Complaint  Patient presents with   Fracture    "It was hurting last night.  It's been throbbing.  I want to see if he can give me some pain pills."    70 y.o. male presents with the above complaint.  Patient presents with a follow-up of left second and fourth digit fracture.  Patient states he is doing a lot better the ecchymosis and bruising is improved.  He does not have any blistering.  He still has some pain here and there but doing much better.  Review of Systems: Negative except as noted in the HPI. Denies N/V/F/Ch.  Past Medical History:  Diagnosis Date   Diabetes mellitus without complication (HCC)    Hyperlipemia    Hypertension     Current Outpatient Medications:    acetaminophen-codeine (TYLENOL #3) 300-30 MG tablet, Take 1-2 tablets by mouth every 4 (four) hours as needed for moderate pain., Disp: 30 tablet, Rfl: 0   atorvastatin (LIPITOR) 20 MG tablet, Take 1 tablet(s) by mouth at bedtime for cholesterol., Disp: 90 tablet, Rfl: 1   Blood Glucose Monitoring Suppl (ONE TOUCH ULTRA MINI) w/Device KIT, Use as directed diagnosis e11.65, Disp: 1 each, Rfl: 0   FARXIGA 10 MG TABS tablet, TAKE 1 TABLET BY MOUTH EVERY DAY BEFORE BREAKFAST, Disp: 30 tablet, Rfl: 1   gabapentin (NEURONTIN) 100 MG capsule, TAKE ONE CAPSULE BY MOUTH TWICE A DAY FOR DIABETIC NEUROPATHY, Disp: 60 capsule, Rfl: 0   glimepiride (AMARYL) 2 MG tablet, Take once per day with biggest meal of the day., Disp: 30 tablet, Rfl: 2   glucose blood test strip, 1 each by Other route daily. Check blood sugars once daily and as needed.  E11.65, Disp: 100 each, Rfl: 3   ibuprofen (ADVIL) 800 MG tablet, Take 1 tablet (800 mg total) by mouth every 8 (eight) hours as needed for moderate pain., Disp: 30 tablet, Rfl: 3   lisinopril-hydrochlorothiazide (ZESTORETIC) 20-12.5 MG tablet, TAKE 1 TABLET BY MOUTH EVERY DAY FOR BLOOD PRESSURE,  Disp: 90 tablet, Rfl: 1   sulfamethoxazole-trimethoprim (BACTRIM DS) 800-160 MG tablet, Take 1 tablet by mouth 2 (two) times daily., Disp: 20 tablet, Rfl: 0   tadalafil (CIALIS) 20 MG tablet, 1 tab 1 hour prior to intercourse, Disp: 5 tablet, Rfl: 0   tizanidine (ZANAFLEX) 2 MG capsule, Take 2 mg by mouth 3 (three) times daily., Disp: , Rfl:    traZODone (DESYREL) 50 MG tablet, Take 0.5-1 tablets (25-50 mg total) by mouth at bedtime as needed for sleep., Disp: 10 tablet, Rfl: 0  Social History   Tobacco Use  Smoking Status Some Days   Types: Cigarettes  Smokeless Tobacco Never  Tobacco Comments   3 a day    No Known Allergies Objective:  There were no vitals filed for this visit. There is no height or weight on file to calculate BMI. Constitutional Well developed. Well nourished.  Vascular Dorsalis pedis pulses palpable bilaterally. Posterior tibial pulses palpable bilaterally. Capillary refill normal to all digits.  No cyanosis or clubbing noted. Pedal hair growth normal.  Neurologic Normal speech. Oriented to person, place, and time. Epicritic sensation to light touch grossly present bilaterally.  Dermatologic Nails well groomed and normal in appearance. No open wounds. No skin lesions.  Orthopedic: No further pain on palpation to the left second digit/great toe/fourth digit.  No further pain with range of motion of all  of the above digits.  No pain with the metatarsophalangeal joint.  No further fracture blister noted.  No signs of purulent drainage noted no redness noted.  No malodor present.   Radiographs: 1. Mildly displaced oblique fracture of the distal shaft of the great toe proximal phalanx. 2. Nondisplaced oblique fracture of the distal shaft of second toe proximal phalanx. 3. Cortical irregularity at the medial aspect of the base of the fourth toe proximal phalanx raises suspicion for minimally displaced avulsion fracture. Assessment:   1. Fracture of multiple  toes, left, closed, initial encounter   2. Uncontrolled type 2 diabetes mellitus with hyperglycemia ([place])    Plan:  Patient was evaluated and treated and all questions answered.  Left great toe/second digit/fourth digit minimally displaced fracture with underlying friction blister I -Clinically healed and doing much better.  Occasional pain noted at this time I discussed with the patient that he can return to regular shoes as he does not have any concern for any kind of ulceration or gangrene.  Patient agrees with the plan.  If any foot and ankle issues arise in future I discussed her to come see me.  I discussed shoe gear modification as well.  No follow-ups on file.

## 2021-08-15 ENCOUNTER — Other Ambulatory Visit: Payer: Self-pay

## 2021-08-15 DIAGNOSIS — M064 Inflammatory polyarthropathy: Secondary | ICD-10-CM

## 2021-08-15 DIAGNOSIS — E1165 Type 2 diabetes mellitus with hyperglycemia: Secondary | ICD-10-CM

## 2021-08-15 MED ORDER — IBUPROFEN 800 MG PO TABS: 800.0000 mg | ORAL_TABLET | Freq: Three times a day (TID) | ORAL | 1 refills | Status: DC | PRN

## 2021-08-15 MED ORDER — DAPAGLIFLOZIN PROPANEDIOL 10 MG PO TABS: ORAL_TABLET | ORAL | 1 refills | Status: DC

## 2021-08-24 ENCOUNTER — Telehealth: Payer: Self-pay

## 2021-08-24 NOTE — Telephone Encounter (Signed)
PA for FARXIGA 10 ng sent 08/24/21 @ 921pm

## 2021-08-25 ENCOUNTER — Encounter: Payer: Self-pay | Admitting: Physician Assistant

## 2021-08-25 ENCOUNTER — Ambulatory Visit: Payer: 59 | Admitting: Physician Assistant

## 2021-08-25 ENCOUNTER — Other Ambulatory Visit: Payer: Self-pay

## 2021-08-25 DIAGNOSIS — I10 Essential (primary) hypertension: Secondary | ICD-10-CM

## 2021-08-25 DIAGNOSIS — Z1211 Encounter for screening for malignant neoplasm of colon: Secondary | ICD-10-CM

## 2021-08-25 DIAGNOSIS — E782 Mixed hyperlipidemia: Secondary | ICD-10-CM

## 2021-08-25 DIAGNOSIS — Z1212 Encounter for screening for malignant neoplasm of rectum: Secondary | ICD-10-CM

## 2021-08-25 DIAGNOSIS — E1165 Type 2 diabetes mellitus with hyperglycemia: Secondary | ICD-10-CM | POA: Diagnosis not present

## 2021-08-25 DIAGNOSIS — R0989 Other specified symptoms and signs involving the circulatory and respiratory systems: Secondary | ICD-10-CM | POA: Diagnosis not present

## 2021-08-25 LAB — POCT GLYCOSYLATED HEMOGLOBIN (HGB A1C): Hemoglobin A1C: 7.7 % — AB (ref 4.0–5.6)

## 2021-08-25 MED ORDER — GLIMEPIRIDE 2 MG PO TABS: ORAL_TABLET | ORAL | 2 refills | Status: DC

## 2021-08-25 MED ORDER — ATORVASTATIN CALCIUM 20 MG PO TABS: ORAL_TABLET | ORAL | 1 refills | Status: DC

## 2021-08-25 NOTE — Progress Notes (Signed)
[facility] [address] [place], [place] [address]  Internal MEDICINE  Office Visit Note  Patient Name: Matthew Gay  01/17/1951  [id number]  Date of Service: 08/31/2021  Chief Complaint  Patient presents with   Follow-up   Diabetes   Hyperlipidemia   Hypertension   Quality Metric Gaps    Colonoscopy,diabetic eye exam    HPI Pt is here for routine follow up -stopped smoking cigarettes, but still has a cigar every once in awhile. He is also increasing water intake to help improve overall health and sugar control. -broke his foot a few months ago and is still in pain from this. He is followed by podiatry. -BG at home 120s and has been taking farxiga and amaryl as prescribed though he states he cannot afford the farxiga now that he cant use the coupon card. A PA was just approved for this but still thinks he wont be able to afford it and may need alternative. Samples given in meantime. -Eye exam scheduled this month -Due for colonoscopy and new referral placed since he was not able to schedule when their office reached out due to breaking foot a few months ago -Reviewed labs with elevated cholesterol and will obtain carotid US and take lipitor nightly -Slightly elevated calcium--will monitor  Current Medication: Outpatient Encounter Medications as of 08/25/2021  Medication Sig   acetaminophen-codeine (TYLENOL #3) 300-30 MG tablet Take 1-2 tablets by mouth every 4 (four) hours as needed for moderate pain.   Blood Glucose Monitoring Suppl (ONE TOUCH ULTRA MINI) w/Device KIT Use as directed diagnosis e11.65   dapagliflozin propanediol (FARXIGA) 10 MG TABS tablet TAKE 1 TABLET BY MOUTH EVERY DAY BEFORE BREAKFAST   gabapentin (NEURONTIN) 100 MG capsule TAKE ONE CAPSULE BY MOUTH TWICE A DAY FOR DIABETIC NEUROPATHY   glucose blood test strip 1 each by Other route daily. Check blood sugars once daily and as needed.  E11.65   ibuprofen (ADVIL) 800 MG tablet Take 1 tablet  (800 mg total) by mouth every 8 (eight) hours as needed for moderate pain.   lisinopril-hydrochlorothiazide (ZESTORETIC) 20-12.5 MG tablet TAKE 1 TABLET BY MOUTH EVERY DAY FOR BLOOD PRESSURE   sulfamethoxazole-trimethoprim (BACTRIM DS) 800-160 MG tablet Take 1 tablet by mouth 2 (two) times daily.   tadalafil (CIALIS) 20 MG tablet 1 tab 1 hour prior to intercourse   tizanidine (ZANAFLEX) 2 MG capsule Take 2 mg by mouth 3 (three) times daily.   traZODone (DESYREL) 50 MG tablet Take 0.5-1 tablets (25-50 mg total) by mouth at bedtime as needed for sleep.   [DISCONTINUED] atorvastatin (LIPITOR) 20 MG tablet Take 1 tablet(s) by mouth at bedtime for cholesterol.   [DISCONTINUED] glimepiride (AMARYL) 2 MG tablet Take once per day with biggest meal of the day.   atorvastatin (LIPITOR) 20 MG tablet Take 1 tablet(s) by mouth at bedtime for cholesterol.   glimepiride (AMARYL) 2 MG tablet Take once per day with biggest meal of the day.   No facility-administered encounter medications on file as of 08/25/2021.    Surgical History: Past Surgical History:  Procedure Laterality Date   colon cancer     COLON SURGERY      Medical History: Past Medical History:  Diagnosis Date   Diabetes mellitus without complication (HCC)    Hyperlipemia    Hypertension     Family History: Family History  Family history unknown: Yes    Social History   Socioeconomic History   Marital status: Married      Spouse name: Not on file   Number of children: Not on file   Years of education: Not on file   Highest education level: Not on file  Occupational History   Not on file  Tobacco Use   Smoking status: Some Days    Types: Cigarettes   Smokeless tobacco: Never   Tobacco comments:    3 a day  Vaping Use   Vaping Use: Never used  Substance and Sexual Activity   Alcohol use: Yes    Comment: Rarely   Drug use: No   Sexual activity: Not on file  Other Topics Concern   Not on file  Social History  Narrative   Not on file   Social Determinants of Health   Financial Resource Strain: Not on file  Food Insecurity: Not on file  Transportation Needs: Not on file  Physical Activity: Not on file  Stress: Not on file  Social Connections: Not on file  Intimate Partner Violence: Not on file      Review of Systems  Constitutional:  Negative for chills, fatigue and unexpected weight change.  HENT:  Negative for congestion, postnasal drip, rhinorrhea, sneezing and sore throat.   Eyes:  Negative for redness.  Respiratory:  Negative for cough, chest tightness and shortness of breath.   Cardiovascular:  Negative for chest pain and palpitations.  Gastrointestinal:  Negative for abdominal pain, constipation, diarrhea, nausea and vomiting.  Genitourinary:  Negative for dysuria and frequency.  Musculoskeletal:  Negative for arthralgias, back pain, joint swelling and neck pain.  Skin:  Negative for rash.  Neurological: Negative.  Negative for tremors and numbness.  Hematological:  Negative for adenopathy. Does not bruise/bleed easily.  Psychiatric/Behavioral:  Negative for behavioral problems (Depression), sleep disturbance and suicidal ideas. The patient is not nervous/anxious.    Vital Signs: BP (!) 147/90   Pulse 66   Temp 97.8 F (36.6 C)   Resp 16   Ht 5' 9" (1.753 m)   Wt 176 lb (79.8 kg)   SpO2 99%   BMI 25.99 kg/m    Physical Exam Vitals and nursing note reviewed.  Constitutional:      General: He is not in acute distress.    Appearance: He is well-developed. He is not diaphoretic.  HENT:     Head: Normocephalic and atraumatic.     Mouth/Throat:     Pharynx: No oropharyngeal exudate.  Eyes:     Pupils: Pupils are equal, round, and reactive to light.  Neck:     Thyroid: No thyromegaly.     Vascular: No JVD.     Trachea: No tracheal deviation.  Cardiovascular:     Rate and Rhythm: Normal rate and regular rhythm.     Heart sounds: Normal heart sounds. No murmur  heard.   No friction rub. No gallop.  Pulmonary:     Effort: Pulmonary effort is normal. No respiratory distress.     Breath sounds: No wheezing or rales.  Chest:     Chest wall: No tenderness.  Abdominal:     General: Bowel sounds are normal.     Palpations: Abdomen is soft.  Musculoskeletal:        General: Normal range of motion.     Cervical back: Normal range of motion and neck supple.  Lymphadenopathy:     Cervical: No cervical adenopathy.  Skin:    General: Skin is warm and dry.  Neurological:     Mental Status: He is alert and oriented to   person, place, and time.     Cranial Nerves: No cranial nerve deficit.  Psychiatric:        Behavior: Behavior normal.        Thought Content: Thought content normal.        Judgment: Judgment normal.       Assessment/Plan: 1. Uncontrolled type 2 diabetes mellitus with hyperglycemia (HCC) - POCT HgB A1C is 7.7 which is improved from 8.4 last visit. Will continue current meds and look into cheaper alternative to [place]. Continue to work on diet and exercise - glimepiride (AMARYL) 2 MG tablet; Take once per day with biggest meal of the day.  Dispense: 30 tablet; Refill: 2  2. Essential hypertension Improved on recheck, continue current meds and monitor closely at home  3. Mixed hyperlipidemia Elevated cholesterol, advised to take lipitor nightly and will check carotid US - atorvastatin (LIPITOR) 20 MG tablet; Take 1 tablet(s) by mouth at bedtime for cholesterol.  Dispense: 90 tablet; Refill: 1  4. Bilateral carotid bruits - US Carotid Duplex Bilateral; Future  5. Screening for colorectal cancer - Ambulatory referral to Gastroenterology   General Counseling: mariusz jubb understanding of the findings of todays visit and agrees with plan of treatment. I have discussed any further diagnostic evaluation that may be needed or ordered today. We also reviewed his medications today. he has been encouraged to call the office with  any questions or concerns that should arise related to todays visit.    Orders Placed This Encounter  Procedures   US Carotid Duplex Bilateral   Ambulatory referral to Gastroenterology   POCT HgB A1C    Meds ordered this encounter  Medications   atorvastatin (LIPITOR) 20 MG tablet    Sig: Take 1 tablet(s) by mouth at bedtime for cholesterol.    Dispense:  90 tablet    Refill:  1   glimepiride (AMARYL) 2 MG tablet    Sig: Take once per day with biggest meal of the day.    Dispense:  30 tablet    Refill:  2    This patient was seen by Drema Dallas, PA-C in collaboration with Dr. Clayborn Bigness as a part of collaborative care agreement.   Total time spent:30 Minutes Time spent includes review of chart, medications, test results, and follow up plan with the patient.      Dr Lavera Guise Internal medicine

## 2021-09-02 ENCOUNTER — Telehealth: Payer: Self-pay

## 2021-09-02 NOTE — Telephone Encounter (Signed)
PA for FARXIGA 10 mg was approved on 09/01/21 and valid from 09/01/21 to 09/01/2022.  Tried to inform patient but no answer and VM was full.  Pharmacy was notified and per pharmacist the amount with coupon for medication was $15.00

## 2021-09-03 ENCOUNTER — Other Ambulatory Visit: Payer: 59

## 2021-09-03 ENCOUNTER — Telehealth: Payer: Self-pay

## 2021-09-03 NOTE — Telephone Encounter (Signed)
Error

## 2021-09-09 ENCOUNTER — Telehealth: Payer: Self-pay

## 2021-09-09 NOTE — Telephone Encounter (Signed)
I have attempted to contact patient to confirm 09/10/21 ultrasound appointment. His cell # rings a couple of times, then rings a busy tone. Wife cell does not have vm set up. I sent him mychart message to call office to confirm-Toni

## 2021-09-09 NOTE — Telephone Encounter (Signed)
Spoke to pt and informed him of the PA for farxiga was approved.

## 2021-09-10 ENCOUNTER — Other Ambulatory Visit: Payer: Self-pay

## 2021-09-10 ENCOUNTER — Ambulatory Visit: Payer: 59

## 2021-09-10 DIAGNOSIS — R0989 Other specified symptoms and signs involving the circulatory and respiratory systems: Secondary | ICD-10-CM | POA: Diagnosis not present

## 2021-09-25 ENCOUNTER — Other Ambulatory Visit: Payer: Self-pay

## 2021-09-25 ENCOUNTER — Ambulatory Visit: Payer: 59 | Admitting: Physician Assistant

## 2021-10-20 ENCOUNTER — Ambulatory Visit: Payer: 59 | Admitting: Physician Assistant

## 2021-11-15 ENCOUNTER — Other Ambulatory Visit: Payer: Self-pay | Admitting: Internal Medicine

## 2021-11-15 DIAGNOSIS — I10 Essential (primary) hypertension: Secondary | ICD-10-CM

## 2021-11-19 ENCOUNTER — Other Ambulatory Visit: Payer: Self-pay | Admitting: Physician Assistant

## 2021-11-19 DIAGNOSIS — E1165 Type 2 diabetes mellitus with hyperglycemia: Secondary | ICD-10-CM

## 2021-11-24 ENCOUNTER — Ambulatory Visit: Payer: 59 | Admitting: Physician Assistant

## 2021-12-02 NOTE — Procedures (Signed)
[facility] [address] [place], [place] [address]  DATE OF SERVICE: September 10, 2021  CAROTID DOPPLER INTERPRETATION:  Bilateral Carotid Ultrsasound and Color Doppler Examination was performed. The RIGHT CCA shows no significant plaque in the vessel. The LEFT CCA shows no significant plaque in the vessel. There was no significant intimal thickening noted in the RIGHT carotid artery. There was no significant intimal thickening in the LEFT carotid artery.  The RIGHT CCA shows peak systolic velocity of 61 cm per second. The end diastolic velocity is 15 cm per second on the RIGHT side. The RIGHT ICA shows peak systolic velocity of 71 per second. RIGHT sided ICA end diastolic velocity is 23 cm per second. The RIGHT ECA shows a peak systolic velocity of 74 cm per second. The ICA/CCA ratio is calculated to be 1.15. This suggests less than 50% stenosis. The Vertebral Artery shows antegrade flow.  The LEFT CCA shows peak systolic velocity of 42 cm per second. The end diastolic velocity is 12 cm per second on the LEFT side. The LEFT ICA shows peak systolic velocity of 80 per second. LEFT sided ICA end diastolic velocity is 30 cm per second. The LEFT ECA shows a peak systolic velocity of 39 cm per second. The ICA/CCA ratio is calculated to be 1.9 to. This suggests 50 to 60% stenosis. The Vertebral Artery shows antegrade flow.   Impression:    The RIGHT CAROTID shows less than 50% stenosis. The LEFT CAROTID shows likely less than 50% stenosis.  There is no significant plaque formation noted on the LEFT and no significant plaque on the RIGHT  side. Consider a repeat Carotid doppler if clinical situation and symptoms warrant in 6-12 months. Patient should be encouraged to change lifestyles such as smoking cessation, regular exercise and dietary modification. Use of statins in the right clinical setting and ASA is encouraged.  Yevonne Pax, MD [facility] Pulmonary Critical Care Medicine

## 2021-12-15 ENCOUNTER — Ambulatory Visit: Payer: 59 | Admitting: Physician Assistant

## 2021-12-15 ENCOUNTER — Other Ambulatory Visit: Payer: Self-pay

## 2021-12-15 ENCOUNTER — Encounter: Payer: Self-pay | Admitting: Physician Assistant

## 2021-12-15 VITALS — BP 138/80 | HR 86 | Temp 98.0°F | Resp 16 | Ht 69.0 in | Wt 182.0 lb

## 2021-12-15 DIAGNOSIS — F17290 Nicotine dependence, other tobacco product, uncomplicated: Secondary | ICD-10-CM

## 2021-12-15 DIAGNOSIS — E1165 Type 2 diabetes mellitus with hyperglycemia: Secondary | ICD-10-CM

## 2021-12-15 DIAGNOSIS — E782 Mixed hyperlipidemia: Secondary | ICD-10-CM

## 2021-12-15 DIAGNOSIS — R69 Illness, unspecified: Secondary | ICD-10-CM | POA: Diagnosis not present

## 2021-12-15 DIAGNOSIS — I10 Essential (primary) hypertension: Secondary | ICD-10-CM

## 2021-12-15 LAB — POCT GLYCOSYLATED HEMOGLOBIN (HGB A1C): Hemoglobin A1C: 8.7 % — AB (ref 4.0–5.6)

## 2021-12-15 MED ORDER — METFORMIN HCL 500 MG PO TABS: 500.0000 mg | ORAL_TABLET | Freq: Every day | ORAL | 3 refills | Status: DC

## 2021-12-15 NOTE — Progress Notes (Signed)
[facility] [place], [place] [address]  Internal MEDICINE  Office Visit Note  Patient Name: Matthew Gay  [id number]  [id number]  Date of Service: 12/17/2021  Chief Complaint  Patient presents with   Follow-up   Diabetes   Hyperlipidemia   Hypertension    HPI Pt is here for routine follow up -BG around 140 at last check -went without meds the last week because wasn't notified his refill was ready at the pharmacy. Discussed to check with pharmacy for refill when close to running out to avoid going without. -Denies any significant increase in sweets/sugars over holidays and denies any changes that could have imp[acted A1c today other than going a week without meds. Discussed A1c moved in the wrong direction and will need to adhere to meds daily and will add new medication for better control. -1-2 beers normally -quit smoking about 2 months ago, but does still smoke cigars daily. Stopped drinking sodas -carotid US reviewed and no significant plaque and <50% stenosis bilaterally -BP at home not really checked -Eye exam set for next month  Current Medication: Outpatient Encounter Medications as of 12/15/2021  Medication Sig   acetaminophen-codeine (TYLENOL #3) 300-30 MG tablet Take 1-2 tablets by mouth every 4 (four) hours as needed for moderate pain.   atorvastatin (LIPITOR) 20 MG tablet Take 1 tablet(s) by mouth at bedtime for cholesterol.   Blood Glucose Monitoring Suppl (ONE TOUCH ULTRA MINI) w/Device KIT Use as directed diagnosis e11.65   dapagliflozin propanediol (FARXIGA) 10 MG TABS tablet TAKE 1 TABLET BY MOUTH EVERY DAY BEFORE BREAKFAST   gabapentin (NEURONTIN) 100 MG capsule TAKE ONE CAPSULE BY MOUTH TWICE A DAY FOR DIABETIC NEUROPATHY   glimepiride (AMARYL) 2 MG tablet TAKE ONCE PER DAY WITH BIGGEST MEAL OF THE DAY.   glucose blood test strip 1 each by Other route daily. Check blood sugars once daily and as needed.  E11.65   ibuprofen (ADVIL) 800  MG tablet Take 1 tablet (800 mg total) by mouth every 8 (eight) hours as needed for moderate pain.   lisinopril-hydrochlorothiazide (ZESTORETIC) 20-12.5 MG tablet TAKE 1 TABLET BY MOUTH EVERY DAY FOR BLOOD PRESSURE   metFORMIN (GLUCOPHAGE) 500 MG tablet Take 1 tablet (500 mg total) by mouth daily with breakfast.   sulfamethoxazole-trimethoprim (BACTRIM DS) 800-160 MG tablet Take 1 tablet by mouth 2 (two) times daily.   tadalafil (CIALIS) 20 MG tablet 1 tab 1 hour prior to intercourse   tizanidine (ZANAFLEX) 2 MG capsule Take 2 mg by mouth 3 (three) times daily.   traZODone (DESYREL) 50 MG tablet Take 0.5-1 tablets (25-50 mg total) by mouth at bedtime as needed for sleep.   No facility-administered encounter medications on file as of 12/15/2021.    Surgical History: Past Surgical History:  Procedure Laterality Date   colon cancer     COLON SURGERY      Medical History: Past Medical History:  Diagnosis Date   Diabetes mellitus without complication ([place])    Hyperlipemia    Hypertension     Family History: Family History  Family history unknown: Yes    Social History   Socioeconomic History   Marital status: Married    Spouse name: Not on file   Number of children: Not on file   Years of education: Not on file   Highest education level: Not on file  Occupational History   Not on file  Tobacco Use   Smoking status: Some Days    Types:  Cigarettes   Smokeless tobacco: Never   Tobacco comments:    3 a day  Vaping Use   Vaping Use: Never used  Substance and Sexual Activity   Alcohol use: Yes    Comment: Rarely   Drug use: No   Sexual activity: Not on file  Other Topics Concern   Not on file  Social History Narrative   Not on file   Social Determinants of Health   Financial Resource Strain: Not on file  Food Insecurity: Not on file  Transportation Needs: Not on file  Physical Activity: Not on file  Stress: Not on file  Social Connections: Not on file  Intimate  Partner Violence: Not on file      Review of Systems  Constitutional:  Negative for chills, fatigue and unexpected weight change.  HENT:  Negative for congestion, postnasal drip, rhinorrhea, sneezing and sore throat.   Eyes:  Negative for redness.  Respiratory:  Negative for cough, chest tightness and shortness of breath.   Cardiovascular:  Negative for chest pain and palpitations.  Gastrointestinal:  Negative for abdominal pain, constipation, diarrhea, nausea and vomiting.  Genitourinary:  Negative for dysuria and frequency.  Musculoskeletal:  Negative for arthralgias, back pain, joint swelling and neck pain.  Skin:  Negative for rash.  Neurological: Negative.  Negative for tremors and numbness.  Hematological:  Negative for adenopathy. Does not bruise/bleed easily.  Psychiatric/Behavioral:  Negative for behavioral problems (Depression), sleep disturbance and suicidal ideas. The patient is not nervous/anxious.    Vital Signs: BP 138/80 Comment: 160/86   Pulse 86    Temp 98 F (36.7 C)    Resp 16    Ht '5\' 9"'  (1.753 m)    Wt 182 lb (82.6 kg)    SpO2 98%    BMI 26.88 kg/m    Physical Exam Vitals and nursing note reviewed.  Constitutional:      General: He is not in acute distress.    Appearance: He is well-developed. He is not diaphoretic.  HENT:     Head: Normocephalic and atraumatic.     Mouth/Throat:     Pharynx: No oropharyngeal exudate.  Eyes:     Pupils: Pupils are equal, round, and reactive to light.  Neck:     Thyroid: No thyromegaly.     Vascular: No JVD.     Trachea: No tracheal deviation.  Cardiovascular:     Rate and Rhythm: Normal rate and regular rhythm.     Heart sounds: Normal heart sounds. No murmur heard.   No friction rub. No gallop.  Pulmonary:     Effort: Pulmonary effort is normal. No respiratory distress.     Breath sounds: No wheezing or rales.  Chest:     Chest wall: No tenderness.  Abdominal:     General: Bowel sounds are normal.      Palpations: Abdomen is soft.  Musculoskeletal:        General: Normal range of motion.     Cervical back: Normal range of motion and neck supple.  Lymphadenopathy:     Cervical: No cervical adenopathy.  Skin:    General: Skin is warm and dry.  Neurological:     Mental Status: He is alert and oriented to person, place, and time.     Cranial Nerves: No cranial nerve deficit.  Psychiatric:        Behavior: Behavior normal.        Thought Content: Thought content normal.  Judgment: Judgment normal.       Assessment/Plan: 1. Uncontrolled type 2 diabetes mellitus with hyperglycemia (HCC) - POCT HgB A1C is 8.7 which is increased from 7.7 last visit. Will continue current medications and advised to adhere to these and not miss any days. Will also add metformin for further control and should work on diet and exercise. - metFORMIN (GLUCOPHAGE) 500 MG tablet; Take 1 tablet (500 mg total) by mouth daily with breakfast.  Dispense: 180 tablet; Refill: 3  2. Essential hypertension Continue current medications  3. Mixed hyperlipidemia Continue lipitor  4. Cigar smoker Advised on cigar smoking cessation and will work on this   General Counseling: Jontez verbalizes understanding of the findings of todays visit and agrees with plan of treatment. I have discussed any further diagnostic evaluation that may be needed or ordered today. We also reviewed his medications today. he has been encouraged to call the office with any questions or concerns that should arise related to todays visit.    Orders Placed This Encounter  Procedures   POCT HgB A1C    Meds ordered this encounter  Medications   metFORMIN (GLUCOPHAGE) 500 MG tablet    Sig: Take 1 tablet (500 mg total) by mouth daily with breakfast.    Dispense:  180 tablet    Refill:  3    This patient was seen by Drema Dallas, PA-C in collaboration with Dr. Clayborn Bigness as a part of collaborative care agreement.   Total time  spent:30 Minutes Time spent includes review of chart, medications, test results, and follow up plan with the patient.      Dr Lavera Guise Internal medicine

## 2021-12-30 ENCOUNTER — Encounter: Payer: Self-pay | Admitting: Internal Medicine

## 2022-02-13 DIAGNOSIS — I10 Essential (primary) hypertension: Secondary | ICD-10-CM | POA: Diagnosis not present

## 2022-02-13 DIAGNOSIS — Z85038 Personal history of other malignant neoplasm of large intestine: Secondary | ICD-10-CM | POA: Diagnosis not present

## 2022-02-13 DIAGNOSIS — E119 Type 2 diabetes mellitus without complications: Secondary | ICD-10-CM | POA: Diagnosis not present

## 2022-02-13 DIAGNOSIS — Z008 Encounter for other general examination: Secondary | ICD-10-CM | POA: Diagnosis not present

## 2022-02-13 DIAGNOSIS — Z87891 Personal history of nicotine dependence: Secondary | ICD-10-CM | POA: Diagnosis not present

## 2022-02-13 DIAGNOSIS — Z7984 Long term (current) use of oral hypoglycemic drugs: Secondary | ICD-10-CM | POA: Diagnosis not present

## 2022-03-06 ENCOUNTER — Encounter: Payer: Self-pay | Admitting: Physician Assistant

## 2022-03-16 ENCOUNTER — Ambulatory Visit (INDEPENDENT_AMBULATORY_CARE_PROVIDER_SITE_OTHER): Payer: Medicare HMO | Admitting: Physician Assistant

## 2022-03-16 ENCOUNTER — Encounter: Payer: Self-pay | Admitting: Physician Assistant

## 2022-03-16 VITALS — BP 127/75 | HR 69 | Temp 98.4°F | Resp 16 | Ht 69.0 in | Wt 182.0 lb

## 2022-03-16 DIAGNOSIS — F17290 Nicotine dependence, other tobacco product, uncomplicated: Secondary | ICD-10-CM | POA: Diagnosis not present

## 2022-03-16 DIAGNOSIS — R5383 Other fatigue: Secondary | ICD-10-CM | POA: Diagnosis not present

## 2022-03-16 DIAGNOSIS — R7989 Other specified abnormal findings of blood chemistry: Secondary | ICD-10-CM

## 2022-03-16 DIAGNOSIS — E782 Mixed hyperlipidemia: Secondary | ICD-10-CM

## 2022-03-16 DIAGNOSIS — E1165 Type 2 diabetes mellitus with hyperglycemia: Secondary | ICD-10-CM

## 2022-03-16 DIAGNOSIS — Z91148 Patient's other noncompliance with medication regimen for other reason: Secondary | ICD-10-CM | POA: Diagnosis not present

## 2022-03-16 DIAGNOSIS — R69 Illness, unspecified: Secondary | ICD-10-CM | POA: Diagnosis not present

## 2022-03-16 DIAGNOSIS — I10 Essential (primary) hypertension: Secondary | ICD-10-CM

## 2022-03-16 LAB — POCT GLYCOSYLATED HEMOGLOBIN (HGB A1C): Hemoglobin A1C: 9.4 % — AB (ref 4.0–5.6)

## 2022-03-16 MED ORDER — ATORVASTATIN CALCIUM 20 MG PO TABS: ORAL_TABLET | ORAL | 1 refills | Status: DC

## 2022-03-16 MED ORDER — DAPAGLIFLOZIN PROPANEDIOL 10 MG PO TABS: ORAL_TABLET | ORAL | 1 refills | Status: DC

## 2022-03-16 MED ORDER — GLIMEPIRIDE 2 MG PO TABS: ORAL_TABLET | ORAL | 1 refills | Status: DC

## 2022-03-16 MED ORDER — LISINOPRIL-HYDROCHLOROTHIAZIDE 20-12.5 MG PO TABS: ORAL_TABLET | ORAL | 1 refills | Status: DC

## 2022-03-16 MED ORDER — METFORMIN HCL 500 MG PO TABS: 500.0000 mg | ORAL_TABLET | Freq: Two times a day (BID) | ORAL | 3 refills | Status: DC

## 2022-03-16 NOTE — Progress Notes (Signed)
[facility] ?[address] ?[place], [place] [address] ? ?Internal MEDICINE  ?Office Visit Note ? ?Patient Name: Matthew Gay ? 10/11/1951  ?[id number] ? ?Date of Service: 03/17/2022 ? ?Chief Complaint  ?Patient presents with  ? Diabetes  ? Hypertension  ? Hyperlipidemia  ? Quality Metric Gaps  ?  Eye exam, colonoscopy  ? ? ?HPI ?Pt is here for routine follow up ?-Has been drinking more sodas again and slipped on diet ?-Has not been taking Farxiga for the last month, because he ran out. Once again discussed not running out of meds and to call pharmacy for refill or contact office if needed. He has been taking glimepiride and metformin once per day. Will restart farxiga and will increase metformin to BID while working on diet. Discussed that if his sugars do not get under better control he will likely need insulin or to see endocrinology. ?-states he is scheduling eye exam ?-trying to fix teeth with dentist so wants to hold off on colonoscopy for now ?-still smoking 1 cigar daily and will try to cut down ?-due for fasting labs prior to CPE next visit ? ?Current Medication: ?Outpatient Encounter Medications as of 03/16/2022  ?Medication Sig  ? acetaminophen-codeine (TYLENOL #3) 300-30 MG tablet Take 1-2 tablets by mouth every 4 (four) hours as needed for moderate pain.  ? Blood Glucose Monitoring Suppl (ONE TOUCH ULTRA MINI) w/Device KIT Use as directed diagnosis e11.65  ? gabapentin (NEURONTIN) 100 MG capsule TAKE ONE CAPSULE BY MOUTH TWICE A DAY FOR DIABETIC NEUROPATHY  ? glucose blood test strip 1 each by Other route daily. Check blood sugars once daily and as needed.  E11.65  ? ibuprofen (ADVIL) 800 MG tablet Take 1 tablet (800 mg total) by mouth every 8 (eight) hours as needed for moderate pain.  ? sulfamethoxazole-trimethoprim (BACTRIM DS) 800-160 MG tablet Take 1 tablet by mouth 2 (two) times daily.  ? tadalafil (CIALIS) 20 MG tablet 1 tab 1 hour prior to intercourse  ? tizanidine (ZANAFLEX) 2 MG  capsule Take 2 mg by mouth 3 (three) times daily.  ? traZODone (DESYREL) 50 MG tablet Take 0.5-1 tablets (25-50 mg total) by mouth at bedtime as needed for sleep.  ? [DISCONTINUED] atorvastatin (LIPITOR) 20 MG tablet Take 1 tablet(s) by mouth at bedtime for cholesterol.  ? [DISCONTINUED] dapagliflozin propanediol (FARXIGA) 10 MG TABS tablet TAKE 1 TABLET BY MOUTH EVERY DAY BEFORE BREAKFAST  ? [DISCONTINUED] glimepiride (AMARYL) 2 MG tablet TAKE ONCE PER DAY WITH BIGGEST MEAL OF THE DAY.  ? [DISCONTINUED] lisinopril-hydrochlorothiazide (ZESTORETIC) 20-12.5 MG tablet TAKE 1 TABLET BY MOUTH EVERY DAY FOR BLOOD PRESSURE  ? [DISCONTINUED] metFORMIN (GLUCOPHAGE) 500 MG tablet Take 1 tablet (500 mg total) by mouth daily with breakfast.  ? atorvastatin (LIPITOR) 20 MG tablet Take 1 tablet(s) by mouth at bedtime for cholesterol.  ? dapagliflozin propanediol (FARXIGA) 10 MG TABS tablet TAKE 1 TABLET BY MOUTH EVERY DAY BEFORE BREAKFAST  ? glimepiride (AMARYL) 2 MG tablet Take 1 tablet by mouth daily with biggest meal of the day  ? lisinopril-hydrochlorothiazide (ZESTORETIC) 20-12.5 MG tablet Take 1 tablet by mouth daily for blood pressure  ? metFORMIN (GLUCOPHAGE) 500 MG tablet Take 1 tablet (500 mg total) by mouth 2 (two) times daily with a meal.  ? ?No facility-administered encounter medications on file as of 03/16/2022.  ? ? ?Surgical History: ?Past Surgical History:  ?Procedure Laterality Date  ? colon cancer    ? COLON SURGERY    ? ? ?  Medical History: ?Past Medical History:  ?Diagnosis Date  ? Diabetes mellitus without complication ([place])   ? Hyperlipemia   ? Hypertension   ? ? ?Family History: ?Family History  ?Family history unknown: Yes  ? ? ?Social History  ? ?Socioeconomic History  ? Marital status: Married  ?  Spouse name: Not on file  ? Number of children: Not on file  ? Years of education: Not on file  ? Highest education level: Not on file  ?Occupational History  ? Not on file  ?Tobacco Use  ? Smoking status: Some  Days  ?  Types: Cigarettes  ? Smokeless tobacco: Never  ? Tobacco comments:  ?  3 a day  ?Vaping Use  ? Vaping Use: Never used  ?Substance and Sexual Activity  ? Alcohol use: Yes  ?  Comment: Rarely  ? Drug use: No  ? Sexual activity: Not on file  ?Other Topics Concern  ? Not on file  ?Social History Narrative  ? Not on file  ? ?Social Determinants of Health  ? ?Financial Resource Strain: Not on file  ?Food Insecurity: Not on file  ?Transportation Needs: Not on file  ?Physical Activity: Not on file  ?Stress: Not on file  ?Social Connections: Not on file  ?Intimate Partner Violence: Not on file  ? ? ? ? ?Review of Systems  ?Constitutional:  Negative for chills, fatigue and unexpected weight change.  ?HENT:  Negative for congestion, postnasal drip, rhinorrhea, sneezing and sore throat.   ?Eyes:  Negative for redness.  ?Respiratory:  Negative for cough, chest tightness and shortness of breath.   ?Cardiovascular:  Negative for chest pain and palpitations.  ?Gastrointestinal:  Negative for abdominal pain, constipation, diarrhea, nausea and vomiting.  ?Genitourinary:  Negative for dysuria and frequency.  ?Musculoskeletal:  Negative for arthralgias, back pain, joint swelling and neck pain.  ?Skin:  Negative for rash.  ?Neurological: Negative.  Negative for tremors and numbness.  ?Hematological:  Negative for adenopathy. Does not bruise/bleed easily.  ?Psychiatric/Behavioral:  Negative for behavioral problems (Depression), sleep disturbance and suicidal ideas. The patient is not nervous/anxious.   ? ?Vital Signs: ?BP 127/75   Pulse 69   Temp 98.4 ?F (36.9 ?C)   Resp 16   Ht 5' 9" (1.753 m)   Wt 182 lb (82.6 kg)   SpO2 98%   BMI 26.88 kg/m?  ? ? ?Physical Exam ?Vitals and nursing note reviewed.  ?Constitutional:   ?   General: He is not in acute distress. ?   Appearance: He is well-developed. He is not diaphoretic.  ?HENT:  ?   Head: Normocephalic and atraumatic.  ?   Mouth/Throat:  ?   Pharynx: No oropharyngeal  exudate.  ?Eyes:  ?   Pupils: Pupils are equal, round, and reactive to light.  ?Neck:  ?   Thyroid: No thyromegaly.  ?   Vascular: No JVD.  ?   Trachea: No tracheal deviation.  ?Cardiovascular:  ?   Rate and Rhythm: Normal rate and regular rhythm.  ?   Heart sounds: Normal heart sounds. No murmur heard. ?  No friction rub. No gallop.  ?Pulmonary:  ?   Effort: Pulmonary effort is normal. No respiratory distress.  ?   Breath sounds: No wheezing or rales.  ?Chest:  ?   Chest wall: No tenderness.  ?Abdominal:  ?   General: Bowel sounds are normal.  ?   Palpations: Abdomen is soft.  ?Musculoskeletal:     ?   General:  Normal range of motion.  ?   Cervical back: Normal range of motion and neck supple.  ?Lymphadenopathy:  ?   Cervical: No cervical adenopathy.  ?Skin: ?   General: Skin is warm and dry.  ?Neurological:  ?   Mental Status: He is alert and oriented to person, place, and time.  ?   Cranial Nerves: No cranial nerve deficit.  ?Psychiatric:     ?   Behavior: Behavior normal.     ?   Thought Content: Thought content normal.     ?   Judgment: Judgment normal.  ? ? ? ? ? ?Assessment/Plan: ?1. Uncontrolled type 2 diabetes mellitus with hyperglycemia (HCC) ?- POCT HgB A1C is 9.4 which is significantly elevated from 8.7 at last visit which was also increased from 7.7 the visit before.  Had a lengthy discussion about needing to get A1c back under better control and that he will need to work on diet and exercise as well as medication compliance.  He will continue glimepiride as before and will increase metformin to twice daily while restarting on Farxiga.  Patient again counseled on the importance of not going without his medications and to contact his pharmacy when he is in need of a refill or to call office directly.  Discussed that if A1c does not start improving patient will likely need to be put on insulin or be referred to endocrinology.   ?- glimepiride (AMARYL) 2 MG tablet; Take 1 tablet by mouth daily with biggest  meal of the day  Dispense: 90 tablet; Refill: 1 ?- dapagliflozin propanediol (FARXIGA) 10 MG TABS tablet; TAKE 1 TABLET BY MOUTH EVERY DAY BEFORE BREAKFAST  Dispense: 90 tablet; Refill: 1 ?- metFORMIN (GLUCOPHAGE) 500

## 2022-03-20 ENCOUNTER — Telehealth: Payer: Self-pay

## 2022-03-23 ENCOUNTER — Telehealth: Payer: Self-pay

## 2022-03-23 NOTE — Telephone Encounter (Signed)
Mailed patient asst application to patient for [facility].  Placed a note inside advising pt to complete form, add any income papers and once completed pt needs to bring back to office and I will fax application to the company ?

## 2022-03-24 NOTE — Telephone Encounter (Signed)
Send message to desha 

## 2022-03-25 NOTE — Telephone Encounter (Signed)
Mailed patient asst application to patient for [facility] ?

## 2022-03-26 ENCOUNTER — Ambulatory Visit: Payer: Medicare HMO | Admitting: Physician Assistant

## 2022-03-30 ENCOUNTER — Telehealth: Payer: Self-pay

## 2022-03-30 NOTE — Telephone Encounter (Signed)
Pt brought PAP to office for [place] to finish completing and will be faxed to [facility] [place] ?

## 2022-04-15 DIAGNOSIS — R7989 Other specified abnormal findings of blood chemistry: Secondary | ICD-10-CM | POA: Diagnosis not present

## 2022-04-15 DIAGNOSIS — R5383 Other fatigue: Secondary | ICD-10-CM | POA: Diagnosis not present

## 2022-04-15 DIAGNOSIS — E1165 Type 2 diabetes mellitus with hyperglycemia: Secondary | ICD-10-CM | POA: Diagnosis not present

## 2022-04-15 DIAGNOSIS — E782 Mixed hyperlipidemia: Secondary | ICD-10-CM | POA: Diagnosis not present

## 2022-04-16 ENCOUNTER — Encounter: Payer: Self-pay | Admitting: Physician Assistant

## 2022-04-16 ENCOUNTER — Ambulatory Visit (INDEPENDENT_AMBULATORY_CARE_PROVIDER_SITE_OTHER): Payer: Medicare HMO | Admitting: Physician Assistant

## 2022-04-16 VITALS — BP 140/83 | HR 95 | Temp 97.8°F | Resp 16 | Ht 69.0 in | Wt 171.0 lb

## 2022-04-16 DIAGNOSIS — E1165 Type 2 diabetes mellitus with hyperglycemia: Secondary | ICD-10-CM

## 2022-04-16 DIAGNOSIS — M7989 Other specified soft tissue disorders: Secondary | ICD-10-CM | POA: Diagnosis not present

## 2022-04-16 DIAGNOSIS — E782 Mixed hyperlipidemia: Secondary | ICD-10-CM | POA: Diagnosis not present

## 2022-04-16 DIAGNOSIS — R300 Dysuria: Secondary | ICD-10-CM

## 2022-04-16 DIAGNOSIS — I10 Essential (primary) hypertension: Secondary | ICD-10-CM | POA: Diagnosis not present

## 2022-04-16 DIAGNOSIS — F5101 Primary insomnia: Secondary | ICD-10-CM | POA: Diagnosis not present

## 2022-04-16 DIAGNOSIS — Z0001 Encounter for general adult medical examination with abnormal findings: Secondary | ICD-10-CM

## 2022-04-16 DIAGNOSIS — R69 Illness, unspecified: Secondary | ICD-10-CM | POA: Diagnosis not present

## 2022-04-16 LAB — CBC WITH DIFFERENTIAL/PLATELET
Basophils Absolute: 0.1 x10E3/uL (ref 0.0–0.2)
Basos: 1 %
EOS (ABSOLUTE): 0.3 x10E3/uL (ref 0.0–0.4)
Eos: 4 %
Hematocrit: 41.8 % (ref 37.5–51.0)
Hemoglobin: 14.1 g/dL (ref 13.0–17.7)
Immature Grans (Abs): 0 x10E3/uL (ref 0.0–0.1)
Immature Granulocytes: 0 %
Lymphocytes Absolute: 2.2 x10E3/uL (ref 0.7–3.1)
Lymphs: 27 %
MCH: 28.6 pg (ref 26.6–33.0)
MCHC: 33.7 g/dL (ref 31.5–35.7)
MCV: 85 fL (ref 79–97)
Monocytes Absolute: 0.5 x10E3/uL (ref 0.1–0.9)
Monocytes: 6 %
Neutrophils Absolute: 4.9 x10E3/uL (ref 1.4–7.0)
Neutrophils: 62 %
Platelets: 233 x10E3/uL (ref 150–450)
RBC: 4.93 x10E6/uL (ref 4.14–5.80)
RDW: 13 % (ref 11.6–15.4)
WBC: 8 x10E3/uL (ref 3.4–10.8)

## 2022-04-16 LAB — COMPREHENSIVE METABOLIC PANEL WITH GFR
ALT: 20 IU/L (ref 0–44)
AST: 14 IU/L (ref 0–40)
Albumin/Globulin Ratio: 1.9 (ref 1.2–2.2)
Albumin: 4.3 g/dL (ref 3.8–4.8)
Alkaline Phosphatase: 90 IU/L (ref 44–121)
BUN/Creatinine Ratio: 21 (ref 10–24)
BUN: 21 mg/dL (ref 8–27)
Bilirubin Total: 0.2 mg/dL (ref 0.0–1.2)
CO2: 26 mmol/L (ref 20–29)
Calcium: 9.9 mg/dL (ref 8.6–10.2)
Chloride: 100 mmol/L (ref 96–106)
Creatinine, Ser: 0.98 mg/dL (ref 0.76–1.27)
Globulin, Total: 2.3 g/dL (ref 1.5–4.5)
Glucose: 132 mg/dL — ABNORMAL HIGH (ref 70–99)
Potassium: 4.4 mmol/L (ref 3.5–5.2)
Sodium: 139 mmol/L (ref 134–144)
Total Protein: 6.6 g/dL (ref 6.0–8.5)
eGFR: 83 mL/min/1.73

## 2022-04-16 LAB — TSH+FREE T4
Free T4: 1.24 ng/dL (ref 0.82–1.77)
TSH: 0.742 u[IU]/mL (ref 0.450–4.500)

## 2022-04-16 LAB — LIPID PANEL WITH LDL/HDL RATIO
Cholesterol, Total: 139 mg/dL (ref 100–199)
HDL: 44 mg/dL (ref >39)
LDL Chol Calc (NIH): 82 mg/dL (ref 0–99)
LDL/HDL Ratio: 1.9 ratio (ref 0.0–3.6)
Triglycerides: 65 mg/dL (ref 0–149)
VLDL Cholesterol Cal: 13 mg/dL (ref 5–40)

## 2022-04-16 MED ORDER — TRAZODONE HCL 50 MG PO TABS: 25.0000 mg | ORAL_TABLET | Freq: Every day | ORAL | 0 refills | Status: DC

## 2022-04-16 MED ORDER — TADALAFIL 20 MG PO TABS: ORAL_TABLET | ORAL | 0 refills | Status: DC

## 2022-04-16 MED ORDER — GLUCOSE BLOOD VI STRP: 1.0000 | ORAL_STRIP | Freq: Every day | 3 refills | Status: DC

## 2022-04-16 NOTE — Progress Notes (Signed)
[facility] [place],  [id number]  Internal MEDICINE  Office Visit Note  Patient Name: Matthew Gay  [id number]  [id number]  Date of Service: 04/22/2022  Chief Complaint  Patient presents with   Medicare Wellness   Diabetes   Hypertension   Hyperlipidemia     HPI Pt is here for routine health maintenance examination -Labs reviewed and look good with lipids improved -needs trazodone refilled to help his sleep  -great grand baby born in [place] is scheduled for June 28th -hx of colon cancer and had resection but no follow up. States he has had a bump on low spine since this in 2008 that has never been evaluated and doesn't bother him and has not changed. Discussed starting with [place] and pursuing other imaging if needed/indicated.  -foot exam performed, long nails and will schedule to have nail cuts -PAP for Farxiga completed and approved and patient should be receiving meds soon. Given samples in meantime.  Current Medication: Outpatient Encounter Medications as of 04/16/2022  Medication Sig   acetaminophen-codeine (TYLENOL #3) 300-30 MG tablet Take 1-2 tablets by mouth every 4 (four) hours as needed for moderate pain.   atorvastatin (LIPITOR) 20 MG tablet Take 1 tablet(s) by mouth at bedtime for cholesterol.   Blood Glucose Monitoring Suppl (ONE TOUCH ULTRA MINI) w/Device KIT Use as directed diagnosis e11.65   dapagliflozin propanediol (FARXIGA) 10 MG TABS tablet TAKE 1 TABLET BY MOUTH EVERY DAY BEFORE BREAKFAST   gabapentin (NEURONTIN) 100 MG capsule TAKE ONE CAPSULE BY MOUTH TWICE A DAY FOR DIABETIC NEUROPATHY   glimepiride (AMARYL) 2 MG tablet Take 1 tablet by mouth daily with biggest meal of the day   ibuprofen (ADVIL) 800 MG tablet Take 1 tablet (800 mg total) by mouth every 8 (eight) hours as needed for moderate pain.   lisinopril-hydrochlorothiazide (ZESTORETIC) 20-12.5 MG tablet Take 1 tablet by mouth daily for blood pressure    metFORMIN (GLUCOPHAGE) 500 MG tablet Take 1 tablet (500 mg total) by mouth 2 (two) times daily with a meal.   sulfamethoxazole-trimethoprim (BACTRIM DS) 800-160 MG tablet Take 1 tablet by mouth 2 (two) times daily.   tizanidine (ZANAFLEX) 2 MG capsule Take 2 mg by mouth 3 (three) times daily.   [DISCONTINUED] glucose blood test strip 1 each by Other route daily. Check blood sugars once daily and as needed.  E11.65   [DISCONTINUED] tadalafil (CIALIS) 20 MG tablet 1 tab 1 hour prior to intercourse   [DISCONTINUED] traZODone (DESYREL) 50 MG tablet Take 0.5-1 tablets (25-50 mg total) by mouth at bedtime as needed for sleep.   glucose blood test strip 1 each by Other route daily. Check blood sugars once daily and as needed.  E11.65   tadalafil (CIALIS) 20 MG tablet 1 tab 1 hour prior to intercourse   traZODone (DESYREL) 50 MG tablet Take 0.5-1 tablets (25-50 mg total) by mouth at bedtime.   [DISCONTINUED] traZODone (DESYREL) 50 MG tablet Take 0.5-1 tablets (25-50 mg total) by mouth at bedtime.   No facility-administered encounter medications on file as of 04/16/2022.    Surgical History: Past Surgical History:  Procedure Laterality Date   colon cancer     COLON SURGERY      Medical History: Past Medical History:  Diagnosis Date   Diabetes mellitus without complication ([place])    Hyperlipemia    Hypertension     Family History: Family History  Family history unknown: Yes      Review  of Systems  Constitutional:  Negative for chills, fatigue and unexpected weight change.  HENT:  Negative for congestion, postnasal drip, rhinorrhea, sneezing and sore throat.   Eyes:  Negative for redness.  Respiratory:  Negative for cough, chest tightness and shortness of breath.   Cardiovascular:  Negative for chest pain and palpitations.  Gastrointestinal:  Negative for abdominal pain, constipation, diarrhea, nausea and vomiting.  Genitourinary:  Negative for dysuria and frequency.  Musculoskeletal:   Negative for arthralgias, back pain, joint swelling and neck pain.       Bump along low back for years  Skin:  Negative for rash.  Neurological: Negative.  Negative for tremors and numbness.  Hematological:  Negative for adenopathy. Does not bruise/bleed easily.  Psychiatric/Behavioral:  Positive for sleep disturbance. Negative for behavioral problems (Depression) and suicidal ideas. The patient is not nervous/anxious.     Vital Signs: BP 140/83   Pulse 95   Temp 97.8 F (36.6 C)   Resp 16   Ht _0  (1.753 m)   Wt 171 lb (77.6 kg)   SpO2 99%   BMI 25.25 kg/m    Physical Exam Vitals and nursing note reviewed.  Constitutional:      General: He is not in acute distress.    Appearance: He is well-developed. He is not diaphoretic.  HENT:     Head: Normocephalic and atraumatic.     Mouth/Throat:     Pharynx: No oropharyngeal exudate.  Eyes:     Pupils: Pupils are equal, round, and reactive to light.  Neck:     Thyroid: No thyromegaly.     Vascular: No JVD.     Trachea: No tracheal deviation.  Cardiovascular:     Rate and Rhythm: Normal rate and regular rhythm.     Heart sounds: Normal heart sounds. No murmur heard.   No friction rub. No gallop.  Pulmonary:     Effort: Pulmonary effort is normal. No respiratory distress.     Breath sounds: No wheezing or rales.  Chest:     Chest wall: No tenderness.  Abdominal:     General: Bowel sounds are normal.     Palpations: Abdomen is soft.     Tenderness: There is no abdominal tenderness.  Musculoskeletal:        General: No tenderness. Normal range of motion.     Cervical back: Normal range of motion and neck supple.     Comments: Mass along low back at midline over spine, non-TTP, somewhat mobile at base, no eyrthema or increased warmth  Lymphadenopathy:     Cervical: No cervical adenopathy.  Skin:    General: Skin is warm and dry.  Neurological:     Mental Status: He is alert and oriented to person, place, and time.      Cranial Nerves: No cranial nerve deficit.  Psychiatric:        Behavior: Behavior normal.        Thought Content: Thought content normal.        Judgment: Judgment normal.     LABS: Recent Results (from the past 2160 hour(s))  POCT HgB A1C     Status: Abnormal   Collection Time: 03/16/22  1:28 PM  Result Value Ref Range   Hemoglobin A1C 9.4 (A) 4.0 - 5.6 %   HbA1c POC (<> result, manual entry)     HbA1c, POC (prediabetic range)     HbA1c, POC (controlled diabetic range)    CBC w/Diff/Platelet  Status: None   Collection Time: 04/15/22 11:45 AM  Result Value Ref Range   WBC 8.0 3.4 - 10.8 x10E3/uL   RBC 4.93 4.14 - 5.80 x10E6/uL   Hemoglobin 14.1 13.0 - 17.7 g/dL   Hematocrit 41.8 37.5 - 51.0 %   MCV 85 79 - 97 fL   MCH 28.6 26.6 - 33.0 pg   MCHC 33.7 31.5 - 35.7 g/dL   RDW 13.0 11.6 - 15.4 %   Platelets 233 150 - 450 x10E3/uL   Neutrophils 62 Not Estab. %   Lymphs 27 Not Estab. %   Monocytes 6 Not Estab. %   Eos 4 Not Estab. %   Basos 1 Not Estab. %   Neutrophils Absolute 4.9 1.4 - 7.0 x10E3/uL   Lymphocytes Absolute 2.2 0.7 - 3.1 x10E3/uL   Monocytes Absolute 0.5 0.1 - 0.9 x10E3/uL   EOS (ABSOLUTE) 0.3 0.0 - 0.4 x10E3/uL   Basophils Absolute 0.1 0.0 - 0.2 x10E3/uL   Immature Granulocytes 0 Not Estab. %   Immature Grans (Abs) 0.0 0.0 - 0.1 x10E3/uL  Comprehensive metabolic panel     Status: Abnormal   Collection Time: 04/15/22 11:45 AM  Result Value Ref Range   Glucose 132 (H) 70 - 99 mg/dL   BUN 21 8 - 27 mg/dL   Creatinine, Ser 0.98 0.76 - 1.27 mg/dL   eGFR 83 >59 mL/min/1.73   BUN/Creatinine Ratio 21 10 - 24   Sodium 139 134 - 144 mmol/L   Potassium 4.4 3.5 - 5.2 mmol/L   Chloride 100 96 - 106 mmol/L   CO2 26 20 - 29 mmol/L   Calcium 9.9 8.6 - 10.2 mg/dL   Total Protein 6.6 6.0 - 8.5 g/dL   Albumin 4.3 3.8 - 4.8 g/dL   Globulin, Total 2.3 1.5 - 4.5 g/dL   Albumin/Globulin Ratio 1.9 1.2 - 2.2   Bilirubin Total 0.2 0.0 - 1.2 mg/dL   Alkaline Phosphatase  90 44 - 121 IU/L   AST 14 0 - 40 IU/L   ALT 20 0 - 44 IU/L  Lipid Panel With LDL/HDL Ratio     Status: None   Collection Time: 04/15/22 11:45 AM  Result Value Ref Range   Cholesterol, Total 139 100 - 199 mg/dL   Triglycerides 65 0 - 149 mg/dL   HDL 44 >39 mg/dL   VLDL Cholesterol Cal 13 5 - 40 mg/dL   LDL Chol Calc (NIH) 82 0 - 99 mg/dL   LDL/HDL Ratio 1.9 0.0 - 3.6 ratio    Comment:                                     LDL/HDL Ratio                                             Men  Women                               1/2 Avg.Risk  1.0    1.5                                   Avg.Risk  3.6    3.2  2X Avg.Risk  6.2    5.0                                3X Avg.Risk  8.0    6.1   TSH + free T4     Status: None   Collection Time: 04/15/22 11:45 AM  Result Value Ref Range   TSH 0.742 0.450 - 4.500 uIU/mL   Free T4 1.24 0.82 - 1.77 ng/dL  UA/M w/rflx Culture, Routine     Status: Abnormal   Collection Time: 04/16/22  2:02 PM   Specimen: Urine   Urine  Result Value Ref Range   Specific Gravity, UA 1.023 1.005 - 1.030   pH, UA 6.0 5.0 - 7.5   Color, UA Yellow Yellow   Appearance Ur Clear Clear   Leukocytes,UA Negative Negative   Protein,UA Negative Negative/Trace   Glucose, UA 1+ (A) Negative   Ketones, UA Negative Negative   RBC, UA Negative Negative   Bilirubin, UA Negative Negative   Urobilinogen, Ur 1.0 0.2 - 1.0 mg/dL   Nitrite, UA Negative Negative   Microscopic Examination Comment     Comment: Microscopic follows if indicated.   Microscopic Examination See below:     Comment: Microscopic was indicated and was performed.   Urinalysis Reflex Comment     Comment: This specimen will not reflex to a Urine Culture.  Microscopic Examination     Status: None   Collection Time: 04/16/22  2:02 PM   Urine  Result Value Ref Range   WBC, UA None seen 0 - 5 /hpf   RBC None seen 0 - 2 /hpf   Epithelial Cells (non renal) None seen 0 - 10 /hpf   Casts  None seen None seen /lpf   Bacteria, UA None seen None seen/Few       Assessment/Plan: 1. Encounter for general adult medical examination with abnormal findings CPE performed, routine fasting labs reviewed, overdue for colon cancer screening however wants to hold off until he finishes dental work but given his history of previous bowel resection due to colon cancer would benefit from follow-up with GI  2. Essential hypertension Stable, continue current medications  3. Uncontrolled type 2 diabetes mellitus with hyperglycemia ([place]) Not yet due for A1c recheck, will continue current medications and patient was given Farxiga samples until his patient assistance program mails his medication which should be coming very soon - glucose blood test strip; 1 each by Other route daily. Check blood sugars once daily and as needed.  E11.65  Dispense: 100 each; Refill: 3  4. Mixed hyperlipidemia Continue atorvastatin, labs greatly improved  5. Primary insomnia May continue trazodone before bed - traZODone (DESYREL) 50 MG tablet; Take 0.5-1 tablets (25-50 mg total) by mouth at bedtime.  Dispense: 10 tablet; Refill: 0  6. Soft tissue mass Patient reports that this mass has been present for approximately 15 years without changing and is not causing him any pain or discomfort.  We will go ahead and order an ultrasound to evaluate further and proceed from there pending results - [place] Spine; Future  7. Dysuria - UA/M w/rflx Culture, Routine   General Counseling: [place] verbalizes understanding of the findings of todays visit and agrees with plan of treatment. I have discussed any further diagnostic evaluation that may be needed or ordered today. We also reviewed his medications today. he has been encouraged to call the office with any questions  or concerns that should arise related to todays visit.    Counseling:    Orders Placed This Encounter  Procedures   Microscopic Examination   [place] Spine    UA/M w/rflx Culture, Routine    Meds ordered this encounter  Medications   DISCONTD: traZODone (DESYREL) 50 MG tablet    Sig: Take 0.5-1 tablets (25-50 mg total) by mouth at bedtime.    Dispense:  10 tablet    Refill:  0    Pt need appt for further refills   glucose blood test strip    Sig: 1 each by Other route daily. Check blood sugars once daily and as needed.  E11.65    Dispense:  100 each    Refill:  3   tadalafil (CIALIS) 20 MG tablet    Sig: 1 tab 1 hour prior to intercourse    Dispense:  10 tablet    Refill:  0   traZODone (DESYREL) 50 MG tablet    Sig: Take 0.5-1 tablets (25-50 mg total) by mouth at bedtime.    Dispense:  30 tablet    Refill:  2    This patient was seen by Drema Dallas, PA-C in collaboration with Dr. Clayborn Bigness as a part of collaborative care agreement.  Total time spent:35 Minutes  Time spent includes review of chart, medications, test results, and follow up plan with the patient.     Lavera Guise, MD  Internal Medicine

## 2022-04-17 LAB — UA/M W/RFLX CULTURE, ROUTINE
Bilirubin, UA: NEGATIVE
Ketones, UA: NEGATIVE
Leukocytes,UA: NEGATIVE
Nitrite, UA: NEGATIVE
Protein,UA: NEGATIVE
RBC, UA: NEGATIVE
Specific Gravity, UA: 1.023 (ref 1.005–1.030)
Urobilinogen, Ur: 1 mg/dL (ref 0.2–1.0)
pH, UA: 6 (ref 5.0–7.5)

## 2022-04-17 LAB — MICROSCOPIC EXAMINATION
Bacteria, UA: NONE SEEN
Casts: NONE SEEN /lpf
Epithelial Cells (non renal): NONE SEEN /hpf (ref 0–10)
RBC, Urine: NONE SEEN /hpf (ref 0–2)
WBC, UA: NONE SEEN /hpf (ref 0–5)

## 2022-04-20 ENCOUNTER — Telehealth: Payer: Self-pay

## 2022-04-20 NOTE — Telephone Encounter (Signed)
Lvm to schedule u/s-Toni 

## 2022-04-22 MED ORDER — TRAZODONE HCL 50 MG PO TABS: 25.0000 mg | ORAL_TABLET | Freq: Every day | ORAL | 2 refills | Status: DC

## 2022-04-23 ENCOUNTER — Ambulatory Visit: Payer: Medicare HMO | Admitting: Physician Assistant

## 2022-04-29 ENCOUNTER — Telehealth: Payer: Self-pay

## 2022-04-29 NOTE — Telephone Encounter (Signed)
Left vm and sent mychart message to confirm 05/04/22 appointment-Toni 

## 2022-05-04 ENCOUNTER — Ambulatory Visit (INDEPENDENT_AMBULATORY_CARE_PROVIDER_SITE_OTHER): Payer: Medicare HMO

## 2022-05-04 DIAGNOSIS — M7989 Other specified soft tissue disorders: Secondary | ICD-10-CM | POA: Diagnosis not present

## 2022-05-04 DIAGNOSIS — M799 Soft tissue disorder, unspecified: Secondary | ICD-10-CM

## 2022-05-05 ENCOUNTER — Telehealth: Payer: Self-pay

## 2022-05-05 ENCOUNTER — Other Ambulatory Visit: Payer: Self-pay | Admitting: Physician Assistant

## 2022-05-05 NOTE — Telephone Encounter (Signed)
Received call from [facility]. After review u/s, they recommend patient have CT done. Sent message to [employer] for order-Toni

## 2022-05-05 NOTE — Telephone Encounter (Signed)
Lvm to schedule f/u to review u/s w/ dfk-Toni

## 2022-05-09 ENCOUNTER — Other Ambulatory Visit: Payer: Self-pay | Admitting: Physician Assistant

## 2022-05-09 DIAGNOSIS — F5101 Primary insomnia: Secondary | ICD-10-CM

## 2022-05-13 DIAGNOSIS — H5213 Myopia, bilateral: Secondary | ICD-10-CM | POA: Diagnosis not present

## 2022-05-13 DIAGNOSIS — Z0100 Encounter for examination of eyes and vision without abnormal findings: Secondary | ICD-10-CM | POA: Diagnosis not present

## 2022-05-13 DIAGNOSIS — H40023 Open angle with borderline findings, high risk, bilateral: Secondary | ICD-10-CM | POA: Diagnosis not present

## 2022-05-13 DIAGNOSIS — E119 Type 2 diabetes mellitus without complications: Secondary | ICD-10-CM | POA: Diagnosis not present

## 2022-05-18 ENCOUNTER — Ambulatory Visit: Payer: Medicare HMO | Admitting: Internal Medicine

## 2022-06-18 ENCOUNTER — Encounter: Payer: Self-pay | Admitting: Physician Assistant

## 2022-06-18 ENCOUNTER — Ambulatory Visit (INDEPENDENT_AMBULATORY_CARE_PROVIDER_SITE_OTHER): Payer: Medicare HMO | Admitting: Physician Assistant

## 2022-06-18 VITALS — BP 140/86 | HR 60 | Temp 98.3°F | Resp 16 | Ht 69.0 in | Wt 166.6 lb

## 2022-06-18 DIAGNOSIS — E1165 Type 2 diabetes mellitus with hyperglycemia: Secondary | ICD-10-CM | POA: Diagnosis not present

## 2022-06-18 DIAGNOSIS — I10 Essential (primary) hypertension: Secondary | ICD-10-CM | POA: Diagnosis not present

## 2022-06-18 DIAGNOSIS — M7989 Other specified soft tissue disorders: Secondary | ICD-10-CM | POA: Diagnosis not present

## 2022-06-18 LAB — POCT GLYCOSYLATED HEMOGLOBIN (HGB A1C): Hemoglobin A1C: 9.5 % — AB (ref 4.0–5.6)

## 2022-06-18 MED ORDER — TRULICITY 0.75 MG/0.5ML ~~LOC~~ SOAJ: 0.7500 mg | SUBCUTANEOUS | 2 refills | Status: DC

## 2022-06-18 NOTE — Progress Notes (Signed)
[facility] [place], [place] [address]  Internal MEDICINE  Office Visit Note  Patient Name: Matthew Gay  [id number]  [id number]  Date of Service: 06/24/2022  Chief Complaint  Patient presents with   Follow-up    Review U/S   Diabetes   Hypertension   Hyperlipidemia    HPI Pt is here for routine follow up -Pt is unable to tolerate metformin due to diarrhea and therefore has not been taking it -will increase glimepiride to 2 tabs (14m total) and continue FIranas before. Will add trulicity to help get numbers down and try to avoid starting insulin -eye exam in June, reports he was given reading glasses but otherwise normal -Unfortunately he missed an appt to review the [place] mass on his low back. Reviewed today which described: poorly defined 4cm localized mass with curvilinear calcifications suspected. There is shadowing involved limiting clear visualization of the internal architecture. Could represent lipoma, but other etiologies cannot be excluded. Supplemental imaging with CT or MRI of the area advisable to assess the mass as well as its relationship to the lumbar spine. -Based on this will go ahead and order MRI of lumbar spine to evaluate area further  Current Medication: Outpatient Encounter Medications as of 06/18/2022  Medication Sig   acetaminophen-codeine (TYLENOL #3) 300-30 MG tablet Take 1-2 tablets by mouth every 4 (four) hours as needed for moderate pain.   atorvastatin (LIPITOR) 20 MG tablet Take 1 tablet(s) by mouth at bedtime for cholesterol.   Blood Glucose Monitoring Suppl (ONE TOUCH ULTRA MINI) w/Device KIT Use as directed diagnosis e11.65   dapagliflozin propanediol (FARXIGA) 10 MG TABS tablet TAKE 1 TABLET BY MOUTH EVERY DAY BEFORE BREAKFAST   Dulaglutide (TRULICITY) 05.05MLZ/7.6BHSOPN Inject 0.75 mg into the skin once a week.   gabapentin (NEURONTIN) 100 MG capsule TAKE ONE CAPSULE BY MOUTH TWICE A DAY FOR DIABETIC NEUROPATHY    glimepiride (AMARYL) 2 MG tablet Take 1 tablet by mouth daily with biggest meal of the day   glucose blood test strip 1 each by Other route daily. Check blood sugars once daily and as needed.  E11.65   ibuprofen (ADVIL) 800 MG tablet Take 1 tablet (800 mg total) by mouth every 8 (eight) hours as needed for moderate pain.   lisinopril-hydrochlorothiazide (ZESTORETIC) 20-12.5 MG tablet Take 1 tablet by mouth daily for blood pressure   tadalafil (CIALIS) 20 MG tablet 1 tab 1 hour prior to intercourse   tizanidine (ZANAFLEX) 2 MG capsule Take 2 mg by mouth 3 (three) times daily.   traZODone (DESYREL) 50 MG tablet TAKE 1/2 TO 1 TABLET (25 TO 50 MG TOTAL) BY MOUTH AT BEDTIME   [DISCONTINUED] metFORMIN (GLUCOPHAGE) 500 MG tablet Take 1 tablet (500 mg total) by mouth 2 (two) times daily with a meal.   [DISCONTINUED] sulfamethoxazole-trimethoprim (BACTRIM DS) 800-160 MG tablet Take 1 tablet by mouth 2 (two) times daily.   No facility-administered encounter medications on file as of 06/18/2022.    Surgical History: Past Surgical History:  Procedure Laterality Date   colon cancer     COLON SURGERY      Medical History: Past Medical History:  Diagnosis Date   Diabetes mellitus without complication ([place]    Hyperlipemia    Hypertension     Family History: Family History  Family history unknown: Yes    Social History   Socioeconomic History   Marital status: Married    Spouse name: Not on file  Number of children: Not on file   Years of education: Not on file   Highest education level: Not on file  Occupational History   Not on file  Tobacco Use   Smoking status: Some Days    Types: Cigars   Smokeless tobacco: Never   Tobacco comments:    3 a day  Vaping Use   Vaping Use: Never used  Substance and Sexual Activity   Alcohol use: Yes    Comment: Rarely   Drug use: No   Sexual activity: Not on file  Other Topics Concern   Not on file  Social History Narrative   Not on file    Social Determinants of Health   Financial Resource Strain: Not on file  Food Insecurity: Not on file  Transportation Needs: Not on file  Physical Activity: Not on file  Stress: Not on file  Social Connections: Not on file  Intimate Partner Violence: Not on file      Review of Systems  Constitutional:  Negative for chills, fatigue and unexpected weight change.  HENT:  Negative for congestion, postnasal drip, rhinorrhea, sneezing and sore throat.   Eyes:  Negative for redness.  Respiratory:  Negative for cough, chest tightness and shortness of breath.   Cardiovascular:  Negative for chest pain and palpitations.  Gastrointestinal:  Negative for abdominal pain, constipation, diarrhea, nausea and vomiting.  Genitourinary:  Negative for dysuria and frequency.  Musculoskeletal:  Negative for arthralgias, back pain, joint swelling and neck pain.  Skin:  Negative for rash.  Neurological: Negative.  Negative for tremors and numbness.  Hematological:  Negative for adenopathy. Does not bruise/bleed easily.  Psychiatric/Behavioral:  Negative for behavioral problems (Depression), sleep disturbance and suicidal ideas. The patient is not nervous/anxious.     Vital Signs: BP (!) 140/86   Pulse 60   Temp 98.3 F (36.8 C)   Resp 16   Ht 5' 9" (1.753 m)   Wt 166 lb 9.6 oz (75.6 kg)   SpO2 99%   BMI 24.60 kg/m    Physical Exam Vitals and nursing note reviewed.  Constitutional:      General: He is not in acute distress.    Appearance: Normal appearance. He is well-developed and normal weight. He is not diaphoretic.  HENT:     Head: Normocephalic and atraumatic.     Mouth/Throat:     Pharynx: No oropharyngeal exudate.  Eyes:     Pupils: Pupils are equal, round, and reactive to light.  Neck:     Thyroid: No thyromegaly.     Vascular: No JVD.     Trachea: No tracheal deviation.  Cardiovascular:     Rate and Rhythm: Normal rate and regular rhythm.     Heart sounds: Normal heart  sounds. No murmur heard.    No friction rub. No gallop.  Pulmonary:     Effort: Pulmonary effort is normal. No respiratory distress.     Breath sounds: No wheezing or rales.  Chest:     Chest wall: No tenderness.  Abdominal:     General: Bowel sounds are normal.     Palpations: Abdomen is soft.  Musculoskeletal:        General: Normal range of motion.     Cervical back: Normal range of motion and neck supple.  Lymphadenopathy:     Cervical: No cervical adenopathy.  Skin:    General: Skin is warm and dry.  Neurological:     Mental Status: He is alert and  oriented to person, place, and time.     Cranial Nerves: No cranial nerve deficit.  Psychiatric:        Behavior: Behavior normal.        Thought Content: Thought content normal.        Judgment: Judgment normal.        Assessment/Plan: 1. Uncontrolled type 2 diabetes mellitus with hyperglycemia (HCC) - POCT HgB A1C is 9.5 which is up from 9.4 last check. Unfortunately he had side effects to metformin and stopped taking this, but did not notify office. Will d/c this today and double glimepiride to 40m and continue FIran Will also start on trulicity and titrate up as needed. If not significantly improving may need to consider insulin/endo referral - Dulaglutide (TRULICITY) 07.56MEP/3.2RJSOPN; Inject 0.75 mg into the skin once a week.  Dispense: 2 mL; Refill: 2  2. Essential hypertension Slightly elevated in office, will continue to monitor and continue current medications  3. Soft tissue mass Mass over low back not well visualized on [place] recommended to have MRI for further evaluation - MR Lumbar Spine Wo Contrast; Future   General Counseling: CSiegfriedverbalizes understanding of the findings of todays visit and agrees with plan of treatment. I have discussed any further diagnostic evaluation that may be needed or ordered today. We also reviewed his medications today. he has been encouraged to call the office with any  questions or concerns that should arise related to todays visit.    Orders Placed This Encounter  Procedures   MR Lumbar Spine Wo Contrast   POCT HgB A1C    Meds ordered this encounter  Medications   Dulaglutide (TRULICITY) 01.88MCZ/6.6AYSOPN    Sig: Inject 0.75 mg into the skin once a week.    Dispense:  2 mL    Refill:  2    This patient was seen by LDrema Dallas PA-C in collaboration with Dr. FClayborn Bignessas a part of collaborative care agreement.   Total time spent:30 Minutes Time spent includes review of chart, medications, test results, and follow up plan with the patient.      Dr FLavera GuiseInternal medicine

## 2022-06-19 ENCOUNTER — Telehealth: Payer: Self-pay

## 2022-06-19 NOTE — Telephone Encounter (Signed)
Lvm notifying patient of MRI appointment date, time and location-Toni

## 2022-06-22 ENCOUNTER — Telehealth: Payer: Self-pay

## 2022-06-22 NOTE — Telephone Encounter (Signed)
Pt called back regarding MRI appt information. Sheralyn Boatman will call back, patient was very hard to hear on phone. Notified patient we will call back, no response.

## 2022-06-24 ENCOUNTER — Ambulatory Visit: Payer: Medicare HMO

## 2022-08-26 ENCOUNTER — Telehealth: Payer: Self-pay

## 2022-08-26 NOTE — Telephone Encounter (Signed)
Pt was approved for program assistance.

## 2022-09-17 ENCOUNTER — Ambulatory Visit (INDEPENDENT_AMBULATORY_CARE_PROVIDER_SITE_OTHER): Payer: Medicare Other | Admitting: Physician Assistant

## 2022-09-17 ENCOUNTER — Encounter: Payer: Self-pay | Admitting: Physician Assistant

## 2022-09-17 VITALS — BP 140/77 | HR 76 | Temp 98.3°F | Resp 16 | Ht 69.0 in | Wt 177.0 lb

## 2022-09-17 DIAGNOSIS — Z23 Encounter for immunization: Secondary | ICD-10-CM

## 2022-09-17 DIAGNOSIS — E1165 Type 2 diabetes mellitus with hyperglycemia: Secondary | ICD-10-CM | POA: Diagnosis not present

## 2022-09-17 DIAGNOSIS — I10 Essential (primary) hypertension: Secondary | ICD-10-CM | POA: Diagnosis not present

## 2022-09-17 LAB — POCT GLYCOSYLATED HEMOGLOBIN (HGB A1C): Hemoglobin A1C: 7.7 % — AB (ref 4.0–5.6)

## 2022-09-17 MED ORDER — LISINOPRIL-HYDROCHLOROTHIAZIDE 20-12.5 MG PO TABS: ORAL_TABLET | ORAL | 1 refills | Status: DC

## 2022-09-17 MED ORDER — GLIMEPIRIDE 2 MG PO TABS: ORAL_TABLET | ORAL | 1 refills | Status: DC

## 2022-09-17 MED ORDER — TRULICITY 0.75 MG/0.5ML ~~LOC~~ SOAJ: 0.7500 mg | SUBCUTANEOUS | 1 refills | Status: DC

## 2022-09-17 NOTE — Progress Notes (Signed)
[facility] [place], [place] [address]  Internal MEDICINE  Office Visit Note  Patient Name: Matthew Gay  [id number]  [id number]  Date of Service: 09/23/2022  Chief Complaint  Patient presents with   Follow-up   Diabetes   Hypertension   Hyperlipidemia   Medication Refill    HPI Pt is here for routine follow up -Doing well on trulicity and really likes this medication. He has only been on it about a month due to waiting on approval/assistance and has already made a big impact on his sugars -Still taking 2tabs glimepiride, and farxiga in AM -MRI of back never done because wife was having brain surgery. Wants to hold off since not bothering him and stable for years. He understands the reasoning behind the order for imaging and will notify office if he changes his mind and would like to move forward. -Bp same on recheck  Current Medication: Outpatient Encounter Medications as of 09/17/2022  Medication Sig   acetaminophen-codeine (TYLENOL #3) 300-30 MG tablet Take 1-2 tablets by mouth every 4 (four) hours as needed for moderate pain.   atorvastatin (LIPITOR) 20 MG tablet Take 1 tablet(s) by mouth at bedtime for cholesterol.   Blood Glucose Monitoring Suppl (ONE TOUCH ULTRA MINI) w/Device KIT Use as directed diagnosis e11.65   dapagliflozin propanediol (FARXIGA) 10 MG TABS tablet TAKE 1 TABLET BY MOUTH EVERY DAY BEFORE BREAKFAST   gabapentin (NEURONTIN) 100 MG capsule TAKE ONE CAPSULE BY MOUTH TWICE A DAY FOR DIABETIC NEUROPATHY   glucose blood test strip 1 each by Other route daily. Check blood sugars once daily and as needed.  E11.65   ibuprofen (ADVIL) 800 MG tablet Take 1 tablet (800 mg total) by mouth every 8 (eight) hours as needed for moderate pain.   tadalafil (CIALIS) 20 MG tablet 1 tab 1 hour prior to intercourse   tizanidine (ZANAFLEX) 2 MG capsule Take 2 mg by mouth 3 (three) times daily.   traZODone (DESYREL) 50 MG tablet TAKE 1/2 TO 1 TABLET  (25 TO 50 MG TOTAL) BY MOUTH AT BEDTIME   [DISCONTINUED] Dulaglutide (TRULICITY) 7.67 HA/1.9FX SOPN Inject 0.75 mg into the skin once a week.   [DISCONTINUED] glimepiride (AMARYL) 2 MG tablet Take 1 tablet by mouth daily with biggest meal of the day   [DISCONTINUED] lisinopril-hydrochlorothiazide (ZESTORETIC) 20-12.5 MG tablet Take 1 tablet by mouth daily for blood pressure   Dulaglutide (TRULICITY) 9.02 IO/9.7DZ SOPN Inject 0.75 mg into the skin once a week.   glimepiride (AMARYL) 2 MG tablet Take 1 tablet by mouth daily with biggest meal of the day   lisinopril-hydrochlorothiazide (ZESTORETIC) 20-12.5 MG tablet Take 1 tablet by mouth daily for blood pressure   No facility-administered encounter medications on file as of 09/17/2022.    Surgical History: Past Surgical History:  Procedure Laterality Date   colon cancer     COLON SURGERY      Medical History: Past Medical History:  Diagnosis Date   Diabetes mellitus without complication ([place])    Hyperlipemia    Hypertension     Family History: Family History  Family history unknown: Yes    Social History   Socioeconomic History   Marital status: Married    Spouse name: Not on file   Number of children: Not on file   Years of education: Not on file   Highest education level: Not on file  Occupational History   Not on file  Tobacco Use   Smoking status: Some  Days    Types: Cigars   Smokeless tobacco: Never   Tobacco comments:    3 a day  Vaping Use   Vaping Use: Never used  Substance and Sexual Activity   Alcohol use: Yes    Comment: Rarely   Drug use: No   Sexual activity: Not on file  Other Topics Concern   Not on file  Social History Narrative   Not on file   Social Determinants of Health   Financial Resource Strain: Not on file  Food Insecurity: Not on file  Transportation Needs: Not on file  Physical Activity: Not on file  Stress: Not on file  Social Connections: Not on file  Intimate Partner  Violence: Not on file      Review of Systems  Constitutional:  Negative for chills, fatigue and unexpected weight change.  HENT:  Negative for congestion, postnasal drip, rhinorrhea, sneezing and sore throat.   Eyes:  Negative for redness.  Respiratory:  Negative for cough, chest tightness and shortness of breath.   Cardiovascular:  Negative for chest pain and palpitations.  Gastrointestinal:  Negative for abdominal pain, constipation, diarrhea, nausea and vomiting.  Genitourinary:  Negative for dysuria and frequency.  Musculoskeletal:  Negative for arthralgias, back pain, joint swelling and neck pain.  Skin:  Negative for rash.  Neurological: Negative.  Negative for tremors and numbness.  Hematological:  Negative for adenopathy. Does not bruise/bleed easily.  Psychiatric/Behavioral:  Negative for behavioral problems (Depression), sleep disturbance and suicidal ideas. The patient is not nervous/anxious.     Vital Signs: BP (!) 140/77   Pulse 76   Temp 98.3 F (36.8 C)   Resp 16   Ht 5' 9" (1.753 m)   Wt 177 lb (80.3 kg)   SpO2 99%   BMI 26.14 kg/m    Physical Exam Vitals and nursing note reviewed.  Constitutional:      General: He is not in acute distress.    Appearance: Normal appearance. He is well-developed and normal weight. He is not diaphoretic.  HENT:     Head: Normocephalic and atraumatic.     Mouth/Throat:     Pharynx: No oropharyngeal exudate.  Eyes:     Pupils: Pupils are equal, round, and reactive to light.  Neck:     Thyroid: No thyromegaly.     Vascular: No JVD.     Trachea: No tracheal deviation.  Cardiovascular:     Rate and Rhythm: Normal rate and regular rhythm.     Heart sounds: Normal heart sounds. No murmur heard.    No friction rub. No gallop.  Pulmonary:     Effort: Pulmonary effort is normal. No respiratory distress.     Breath sounds: No wheezing or rales.  Chest:     Chest wall: No tenderness.  Abdominal:     General: Bowel sounds  are normal.     Palpations: Abdomen is soft.  Musculoskeletal:        General: Normal range of motion.     Cervical back: Normal range of motion and neck supple.  Lymphadenopathy:     Cervical: No cervical adenopathy.  Skin:    General: Skin is warm and dry.  Neurological:     Mental Status: He is alert and oriented to person, place, and time.     Cranial Nerves: No cranial nerve deficit.  Psychiatric:        Behavior: Behavior normal.        Thought Content: Thought content normal.  Judgment: Judgment normal.        Assessment/Plan: 1. Uncontrolled type 2 diabetes mellitus with hyperglycemia (HCC) - POCT HgB A1C is 7.7 which is significantly improved from 9.5 last visit despite only being on trulicity for the last month. Will cotinue current medications and consider increasing trulicity in future and decreasing amaryl as sugars improve. - glimepiride (AMARYL) 2 MG tablet; Take 1 tablet by mouth daily with biggest meal of the day  Dispense: 90 tablet; Refill: 1 - Dulaglutide (TRULICITY) 2.02 RK/2.7CW SOPN; Inject 0.75 mg into the skin once a week.  Dispense: 6 mL; Refill: 1  2. Essential hypertension Borderline in office and will continue current medications - lisinopril-hydrochlorothiazide (ZESTORETIC) 20-12.5 MG tablet; Take 1 tablet by mouth daily for blood pressure  Dispense: 90 tablet; Refill: 1  3. Flu vaccine need - Flu Vaccine MDCK QUAD PF   General Counseling: Matthew Gay verbalizes understanding of the findings of todays visit and agrees with plan of treatment. I have discussed any further diagnostic evaluation that may be needed or ordered today. We also reviewed his medications today. he has been encouraged to call the office with any questions or concerns that should arise related to todays visit.    Orders Placed This Encounter  Procedures   Flu Vaccine MDCK QUAD PF   POCT HgB A1C    Meds ordered this encounter  Medications    lisinopril-hydrochlorothiazide (ZESTORETIC) 20-12.5 MG tablet    Sig: Take 1 tablet by mouth daily for blood pressure    Dispense:  90 tablet    Refill:  1   glimepiride (AMARYL) 2 MG tablet    Sig: Take 1 tablet by mouth daily with biggest meal of the day    Dispense:  90 tablet    Refill:  1    DX Code Needed  .   Dulaglutide (TRULICITY) 2.37 SE/8.3TD SOPN    Sig: Inject 0.75 mg into the skin once a week.    Dispense:  6 mL    Refill:  1    This patient was seen by Drema Dallas, PA-C in collaboration with Dr. Clayborn Bigness as a part of collaborative care agreement.   Total time spent:30 Minutes Time spent includes review of chart, medications, test results, and follow up plan with the patient.      Dr Lavera Guise Internal medicine

## 2022-12-17 ENCOUNTER — Ambulatory Visit: Payer: Medicare Other | Admitting: Physician Assistant

## 2022-12-21 ENCOUNTER — Ambulatory Visit: Payer: Medicare Other | Admitting: Physician Assistant

## 2022-12-25 ENCOUNTER — Encounter: Payer: Self-pay | Admitting: Nurse Practitioner

## 2022-12-25 ENCOUNTER — Ambulatory Visit (INDEPENDENT_AMBULATORY_CARE_PROVIDER_SITE_OTHER): Payer: Medicare Other | Admitting: Nurse Practitioner

## 2022-12-25 VITALS — BP 133/78 | HR 77 | Temp 98.4°F | Resp 16 | Ht 69.0 in | Wt 172.2 lb

## 2022-12-25 DIAGNOSIS — I10 Essential (primary) hypertension: Secondary | ICD-10-CM

## 2022-12-25 DIAGNOSIS — F5101 Primary insomnia: Secondary | ICD-10-CM | POA: Diagnosis not present

## 2022-12-25 DIAGNOSIS — E782 Mixed hyperlipidemia: Secondary | ICD-10-CM

## 2022-12-25 DIAGNOSIS — M064 Inflammatory polyarthropathy: Secondary | ICD-10-CM | POA: Diagnosis not present

## 2022-12-25 DIAGNOSIS — N522 Drug-induced erectile dysfunction: Secondary | ICD-10-CM | POA: Diagnosis not present

## 2022-12-25 DIAGNOSIS — E1165 Type 2 diabetes mellitus with hyperglycemia: Secondary | ICD-10-CM

## 2022-12-25 LAB — POCT GLYCOSYLATED HEMOGLOBIN (HGB A1C): Hemoglobin A1C: 8.6 % — AB (ref 4.0–5.6)

## 2022-12-25 MED ORDER — GABAPENTIN 100 MG PO CAPS: ORAL_CAPSULE | ORAL | 5 refills | Status: DC

## 2022-12-25 MED ORDER — GLIMEPIRIDE 2 MG PO TABS: ORAL_TABLET | ORAL | 1 refills | Status: DC

## 2022-12-25 MED ORDER — TRAZODONE HCL 50 MG PO TABS: ORAL_TABLET | ORAL | 1 refills | Status: DC

## 2022-12-25 MED ORDER — DULAGLUTIDE 1.5 MG/0.5ML ~~LOC~~ SOAJ: 1.5000 mg | SUBCUTANEOUS | 5 refills | Status: DC

## 2022-12-25 MED ORDER — LISINOPRIL-HYDROCHLOROTHIAZIDE 20-12.5 MG PO TABS: ORAL_TABLET | ORAL | 1 refills | Status: DC

## 2022-12-25 MED ORDER — ACETAMINOPHEN-CODEINE 300-30 MG PO TABS: 1.0000 | ORAL_TABLET | Freq: Four times a day (QID) | ORAL | 0 refills | Status: DC | PRN

## 2022-12-25 MED ORDER — DAPAGLIFLOZIN PROPANEDIOL 10 MG PO TABS: ORAL_TABLET | ORAL | 1 refills | Status: DC

## 2022-12-25 MED ORDER — TADALAFIL 20 MG PO TABS: ORAL_TABLET | ORAL | 0 refills | Status: DC

## 2022-12-25 MED ORDER — ATORVASTATIN CALCIUM 20 MG PO TABS: ORAL_TABLET | ORAL | 1 refills | Status: DC

## 2022-12-25 NOTE — Progress Notes (Signed)
[facility] [place], [place] [address]  Internal MEDICINE  Office Visit Note  Patient Name: Matthew Gay  [id number]  [id number]  Date of Service: 12/25/2022  Chief Complaint  Patient presents with   Hypertension   Hyperlipidemia   Diabetes    HPI Breandan presents for a follow-up visit for diabetes, hypertension and insomnia.  Diabetes -- A1c elevated at 8.6, ran out of trulicity and had no refills. Is on 0.75 mg weekly Hypertension -- needs refills, well controlled at this time.  Insomnia -- takes trazodone which is effective Due for multiple refills.      Current Medication: Outpatient Encounter Medications as of 12/25/2022  Medication Sig   Blood Glucose Monitoring Suppl (ONE TOUCH ULTRA MINI) w/Device KIT Use as directed diagnosis e11.65   glucose blood test strip 1 each by Other route daily. Check blood sugars once daily and as needed.  E11.65   ibuprofen (ADVIL) 800 MG tablet Take 1 tablet (800 mg total) by mouth every 8 (eight) hours as needed for moderate pain.   tizanidine (ZANAFLEX) 2 MG capsule Take 2 mg by mouth 3 (three) times daily.   [DISCONTINUED] acetaminophen-codeine (TYLENOL #3) 300-30 MG tablet Take 1-2 tablets by mouth every 4 (four) hours as needed for moderate pain.   [DISCONTINUED] atorvastatin (LIPITOR) 20 MG tablet Take 1 tablet(s) by mouth at bedtime for cholesterol.   [DISCONTINUED] dapagliflozin propanediol (FARXIGA) 10 MG TABS tablet TAKE 1 TABLET BY MOUTH EVERY DAY BEFORE BREAKFAST   [DISCONTINUED] Dulaglutide (TRULICITY) [id number] 0000000 SOPN Inject 0.75 mg into the skin once a week.   [DISCONTINUED] gabapentin (NEURONTIN) 100 MG capsule TAKE ONE CAPSULE BY MOUTH TWICE A DAY FOR DIABETIC NEUROPATHY   [DISCONTINUED] glimepiride (AMARYL) 2 MG tablet Take 1 tablet by mouth daily with biggest meal of the day   [DISCONTINUED] lisinopril-hydrochlorothiazide (ZESTORETIC) 20-12.5 MG tablet Take 1 tablet by mouth daily for blood  pressure   [DISCONTINUED] tadalafil (CIALIS) 20 MG tablet 1 tab 1 hour prior to intercourse   [DISCONTINUED] traZODone (DESYREL) 50 MG tablet TAKE 1/2 TO 1 TABLET (25 TO 50 MG TOTAL) BY MOUTH AT BEDTIME   acetaminophen-codeine (TYLENOL #3) 300-30 MG tablet Take 1 tablet by mouth every 6 (six) hours as needed for moderate pain or severe pain.   atorvastatin (LIPITOR) 20 MG tablet Take 1 tablet(s) by mouth at bedtime for cholesterol.   dapagliflozin propanediol (FARXIGA) 10 MG TABS tablet TAKE 1 TABLET BY MOUTH EVERY DAY BEFORE BREAKFAST   Dulaglutide 1.5 MG/0.5ML SOPN Inject 1.5 mg into the skin once a week.   gabapentin (NEURONTIN) 100 MG capsule TAKE ONE CAPSULE BY MOUTH TWICE A DAY FOR DIABETIC NEUROPATHY   glimepiride (AMARYL) 2 MG tablet Take 1 tablet by mouth daily with biggest meal of the day   lisinopril-hydrochlorothiazide (ZESTORETIC) 20-12.5 MG tablet Take 1 tablet by mouth daily for blood pressure   tadalafil (CIALIS) 20 MG tablet 1 tab 1 hour prior to intercourse   traZODone (DESYREL) 50 MG tablet TAKE 1/2 TO 1 TABLET (25 TO 50 MG TOTAL) BY MOUTH AT BEDTIME   No facility-administered encounter medications on file as of 12/25/2022.    Surgical History: Past Surgical History:  Procedure Laterality Date   colon cancer     COLON SURGERY      Medical History: Past Medical History:  Diagnosis Date   Diabetes mellitus without complication ([place])    Hyperlipemia    Hypertension     Family History: Family History  Family history unknown: Yes    Social History   Socioeconomic History   Marital status: Married    Spouse name: Not on file   Number of children: Not on file   Years of education: Not on file   Highest education level: Not on file  Occupational History   Not on file  Tobacco Use   Smoking status: Some Days    Types: Cigars   Smokeless tobacco: Never   Tobacco comments:    3 a day  Vaping Use   Vaping Use: Never used  Substance and Sexual Activity    Alcohol use: Yes    Comment: Rarely   Drug use: No   Sexual activity: Not on file  Other Topics Concern   Not on file  Social History Narrative   Not on file   Social Determinants of Health   Financial Resource Strain: Not on file  Food Insecurity: Not on file  Transportation Needs: Not on file  Physical Activity: Not on file  Stress: Not on file  Social Connections: Not on file  Intimate Partner Violence: Not on file      Review of Systems  Constitutional:  Negative for chills, fatigue and unexpected weight change.  HENT:  Negative for congestion, rhinorrhea, sneezing and sore throat.   Eyes:  Negative for redness.  Respiratory: Negative.  Negative for cough, chest tightness, shortness of breath and wheezing.   Cardiovascular: Negative.  Negative for chest pain and palpitations.  Gastrointestinal:  Negative for abdominal pain, constipation, diarrhea, nausea and vomiting.  Genitourinary:  Negative for dysuria and frequency.  Musculoskeletal:  Positive for arthralgias. Negative for back pain, joint swelling and neck pain.  Skin:  Negative for rash.  Neurological: Negative.  Negative for tremors and numbness.  Hematological:  Negative for adenopathy. Does not bruise/bleed easily.  Psychiatric/Behavioral:  Positive for sleep disturbance. Negative for behavioral problems (Depression), self-injury and suicidal ideas. The patient is not nervous/anxious.     Vital Signs: BP 133/78   Pulse 77   Temp 98.4 F (36.9 C)   Resp 16   Ht 5' 9"$  (1.753 m)   Wt 172 lb 3.2 oz (78.1 kg)   SpO2 97%   BMI 25.43 kg/m    Physical Exam Vitals reviewed.  Constitutional:      General: He is not in acute distress.    Appearance: Normal appearance. He is not ill-appearing.  HENT:     Head: Normocephalic and atraumatic.  Eyes:     Pupils: Pupils are equal, round, and reactive to light.  Cardiovascular:     Rate and Rhythm: Normal rate and regular rhythm.  Pulmonary:     Effort:  Pulmonary effort is normal. No respiratory distress.  Neurological:     Mental Status: He is alert and oriented to person, place, and time.  Psychiatric:        Mood and Affect: Mood normal.        Behavior: Behavior normal.        Assessment/Plan: 1. Uncontrolled type 2 diabetes mellitus with hyperglycemia (HCC) Elevated [id number], trulicity dose increased to 1.5 mg weekly. Continue farxiga and glimepiride as prescribed. Continue gabapentin for neuropathy as prescribed.  - POCT glycosylated hemoglobin (Hb A1C) - glimepiride (AMARYL) 2 MG tablet; Take 1 tablet by mouth daily with biggest meal of the day  Dispense: 90 tablet; Refill: 1 - gabapentin (NEURONTIN) 100 MG capsule; TAKE ONE CAPSULE BY MOUTH TWICE A DAY FOR DIABETIC NEUROPATHY  Dispense: 60 capsule;  Refill: 5 - dapagliflozin propanediol (FARXIGA) 10 MG TABS tablet; TAKE 1 TABLET BY MOUTH EVERY DAY BEFORE BREAKFAST  Dispense: 90 tablet; Refill: 1 - Dulaglutide 1.5 MG/0.5ML SOPN; Inject 1.5 mg into the skin once a week.  Dispense: 2 mL; Refill: 5  2. Essential hypertension Stable, continue lisinopril-HCTZ as prescribed.  - lisinopril-hydrochlorothiazide (ZESTORETIC) 20-12.5 MG tablet; Take 1 tablet by mouth daily for blood pressure  Dispense: 90 tablet; Refill: 1  3. Mixed hyperlipidemia Continue atorvastatin as prescribed.  - atorvastatin (LIPITOR) 20 MG tablet; Take 1 tablet(s) by mouth at bedtime for cholesterol.  Dispense: 90 tablet; Refill: 1  4. Drug-induced erectile dysfunction Continue prn tadalafil as prescribed.  - tadalafil (CIALIS) 20 MG tablet; 1 tab 1 hour prior to intercourse  Dispense: 10 tablet; Refill: 0  5. Inflammatory polyarthropathy ([place]) Continue tylenol #3 prn as prescribed.  - acetaminophen-codeine (TYLENOL #3) 300-30 MG tablet; Take 1 tablet by mouth every 6 (six) hours as needed for moderate pain or severe pain.  Dispense: 20 tablet; Refill: 0  6. Primary insomnia Continue trazodone as prescribed.  -  traZODone (DESYREL) 50 MG tablet; TAKE 1/2 TO 1 TABLET (25 TO 50 MG TOTAL) BY MOUTH AT BEDTIME  Dispense: 90 tablet; Refill: 1   General Counseling: Winter verbalizes understanding of the findings of todays visit and agrees with plan of treatment. I have discussed any further diagnostic evaluation that may be needed or ordered today. We also reviewed his medications today. he has been encouraged to call the office with any questions or concerns that should arise related to todays visit.    Orders Placed This Encounter  Procedures   POCT glycosylated hemoglobin (Hb A1C)    Meds ordered this encounter  Medications   traZODone (DESYREL) 50 MG tablet    Sig: TAKE 1/2 TO 1 TABLET (25 TO 50 MG TOTAL) BY MOUTH AT BEDTIME    Dispense:  90 tablet    Refill:  1   tadalafil (CIALIS) 20 MG tablet    Sig: 1 tab 1 hour prior to intercourse    Dispense:  10 tablet    Refill:  0   lisinopril-hydrochlorothiazide (ZESTORETIC) 20-12.5 MG tablet    Sig: Take 1 tablet by mouth daily for blood pressure    Dispense:  90 tablet    Refill:  1   glimepiride (AMARYL) 2 MG tablet    Sig: Take 1 tablet by mouth daily with biggest meal of the day    Dispense:  90 tablet    Refill:  1    DX Code Needed  .   gabapentin (NEURONTIN) 100 MG capsule    Sig: TAKE ONE CAPSULE BY MOUTH TWICE A DAY FOR DIABETIC NEUROPATHY    Dispense:  60 capsule    Refill:  5   dapagliflozin propanediol (FARXIGA) 10 MG TABS tablet    Sig: TAKE 1 TABLET BY MOUTH EVERY DAY BEFORE BREAKFAST    Dispense:  90 tablet    Refill:  1    DX Code Needed  .   atorvastatin (LIPITOR) 20 MG tablet    Sig: Take 1 tablet(s) by mouth at bedtime for cholesterol.    Dispense:  90 tablet    Refill:  1   Dulaglutide 1.5 MG/0.5ML SOPN    Sig: Inject 1.5 mg into the skin once a week.    Dispense:  2 mL    Refill:  5    Note increase dose, please fill new script today  acetaminophen-codeine (TYLENOL #3) 300-30 MG tablet    Sig: Take 1 tablet by  mouth every 6 (six) hours as needed for moderate pain or severe pain.    Dispense:  20 tablet    Refill:  0    Return in about 3 months (around 03/31/2023) for F/U, Recheck A1C with lauren PA-C PCP.   Total time spent:30 Minutes Time spent includes review of chart, medications, test results, and follow up plan with the patient.   [place] Controlled Substance Database was reviewed by me.  This patient was seen by Jonetta Osgood, FNP-C in collaboration with Dr. Clayborn Bigness as a part of collaborative care agreement.   Kenya Shiraishi R. Valetta Fuller, MSN, FNP-C Internal medicine

## 2023-01-04 ENCOUNTER — Encounter: Payer: Self-pay | Admitting: Nurse Practitioner

## 2023-01-23 IMAGING — CR DG FOOT 2V*L*
2 series · 2 of 2 positions shown · non-contrast
Comparison: None.

CLINICAL DATA: First and second toe fractures seen at [HOSPITAL].

EXAM:
LEFT FOOT - 2 VIEW

[foot ap]
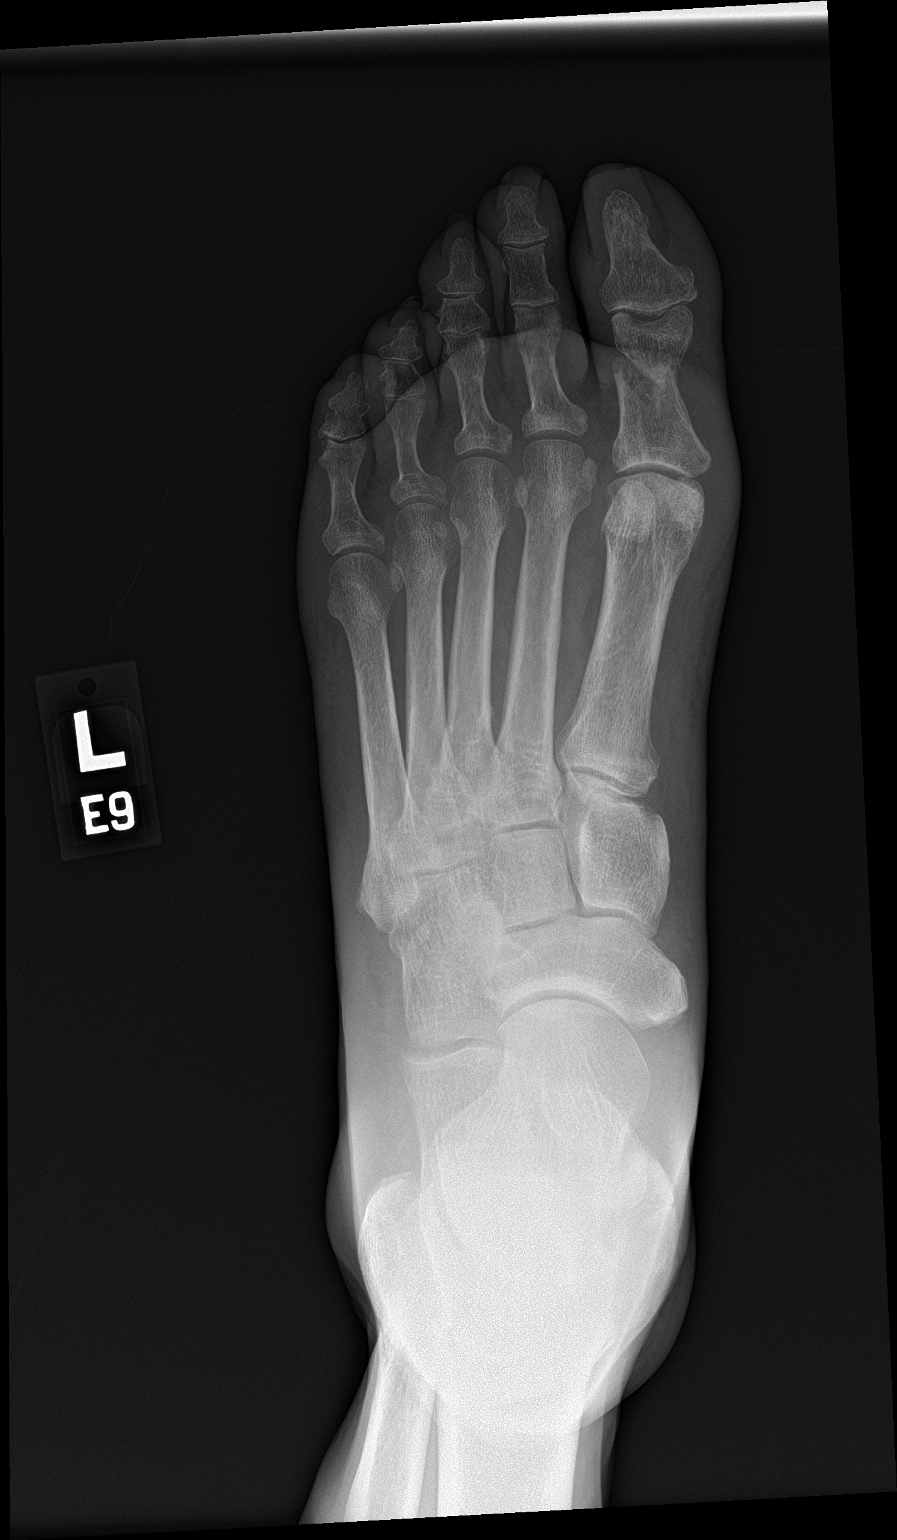

[foot lat]
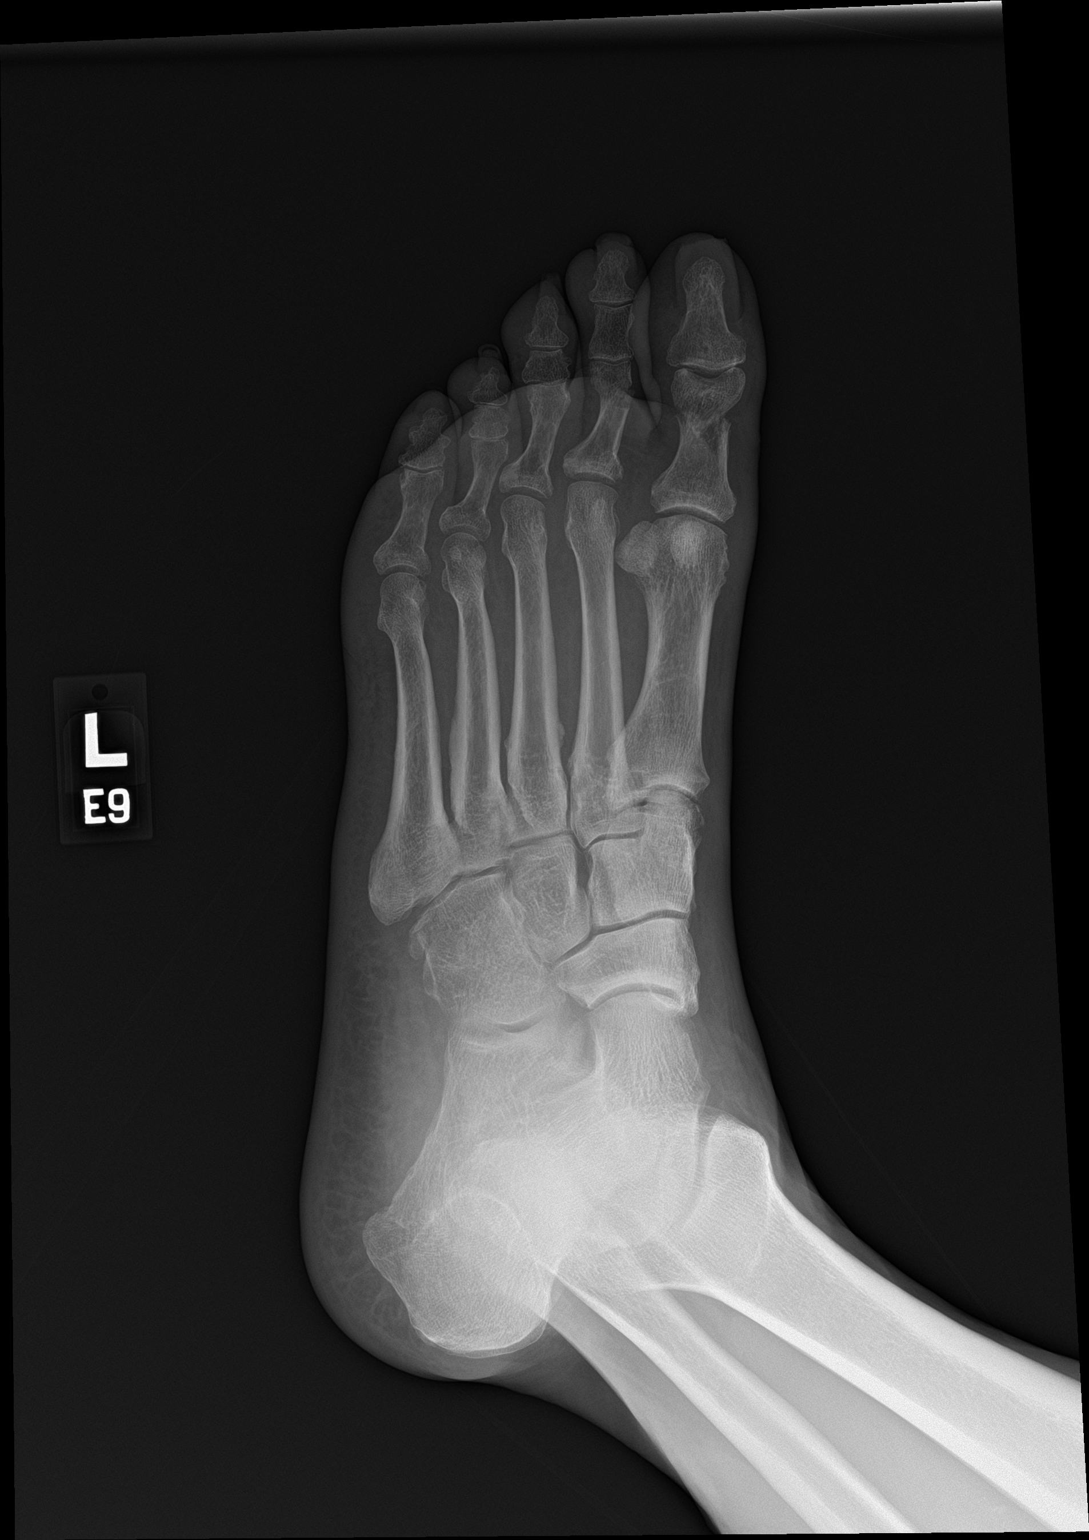

[2 of 2 positions shown; findings below may reference images not displayed]

FINDINGS: Mildly displaced oblique fracture of the distal shaft of the great
toe proximal phalanx. The distal fracture fragment is displaced
medially approximately 0.4 cm. No angulation.

Nondisplaced oblique fracture of the distal shaft of the second toe
proximal phalanx. No angulation.

Cortical irregularity along the medial aspect of the base of the
fourth toe proximal phalanx may represent a minimally displaced
avulsion fracture, seen on AP view only.

No dislocation. Fusion of the fifth toe mid and distal phalanges is
likely congenital.
IMPRESSION: 1. Mildly displaced oblique fracture of the distal shaft of the
great toe proximal phalanx.
2. Nondisplaced oblique fracture of the distal shaft of second toe
proximal phalanx.
3. Cortical irregularity at the medial aspect of the base of the
fourth toe proximal phalanx raises suspicion for minimally displaced
avulsion fracture.

## 2023-03-26 ENCOUNTER — Ambulatory Visit: Payer: No Typology Code available for payment source | Admitting: Physician Assistant

## 2023-03-29 ENCOUNTER — Ambulatory Visit (INDEPENDENT_AMBULATORY_CARE_PROVIDER_SITE_OTHER): Payer: No Typology Code available for payment source | Admitting: Physician Assistant

## 2023-03-29 ENCOUNTER — Encounter: Payer: Self-pay | Admitting: Physician Assistant

## 2023-03-29 VITALS — BP 140/82 | HR 82 | Temp 97.8°F | Resp 16 | Ht 69.0 in | Wt 173.0 lb

## 2023-03-29 DIAGNOSIS — I10 Essential (primary) hypertension: Secondary | ICD-10-CM

## 2023-03-29 DIAGNOSIS — E1165 Type 2 diabetes mellitus with hyperglycemia: Secondary | ICD-10-CM | POA: Diagnosis not present

## 2023-03-29 LAB — POCT UA - MICROALBUMIN
Albumin/Creatinine Ratio, Urine, POC: <30
Creatinine, POC: 100 mg/dL
Microalbumin Ur, POC: 30 mg/L

## 2023-03-29 LAB — POCT GLYCOSYLATED HEMOGLOBIN (HGB A1C): Hemoglobin A1C: 8.9 % — AB (ref 4.0–5.6)

## 2023-03-29 MED ORDER — TRULICITY 0.75 MG/0.5ML ~~LOC~~ SOAJ: 0.7500 mg | SUBCUTANEOUS | 1 refills | Status: DC

## 2023-03-29 NOTE — Progress Notes (Signed)
[facility] [address] [place], [place] [address]  Internal MEDICINE  Office Visit Note  Patient Name: Matthew Gay  [id number]  [id number]  Date of Service: 03/29/2023  Chief Complaint  Patient presents with   Follow-up   Diabetes   Hypertension   Quality Metric Gaps    colonoscopy    HPI Pt is here for routine follow up -has not had any trulicity for months, states he never got the higher dose prescribed last visit and has been without any dose since before last visit now. Unclear if this was due to shortage at the time. Will restart lower dose and titrate up -had some sodas recently as well -Bp at home not checked, has not taken meds yet. BP 142/78 on recheck -BG at home have been elevated  Current Medication: Outpatient Encounter Medications as of 03/29/2023  Medication Sig   acetaminophen-codeine (TYLENOL #3) 300-30 MG tablet Take 1 tablet by mouth every 6 (six) hours as needed for moderate pain or severe pain.   atorvastatin (LIPITOR) 20 MG tablet Take 1 tablet(s) by mouth at bedtime for cholesterol.   Blood Glucose Monitoring Suppl (ONE TOUCH ULTRA MINI) w/Device KIT Use as directed diagnosis e11.65   dapagliflozin propanediol (FARXIGA) 10 MG TABS tablet TAKE 1 TABLET BY MOUTH EVERY DAY BEFORE BREAKFAST   Dulaglutide (TRULICITY) 0.75 MG/0.5ML SOPN Inject 0.75 mg into the skin once a week.   gabapentin (NEURONTIN) 100 MG capsule TAKE ONE CAPSULE BY MOUTH TWICE A DAY FOR DIABETIC NEUROPATHY   glimepiride (AMARYL) 2 MG tablet Take 1 tablet by mouth daily with biggest meal of the day   glucose blood test strip 1 each by Other route daily. Check blood sugars once daily and as needed.  E11.65   ibuprofen (ADVIL) 800 MG tablet Take 1 tablet (800 mg total) by mouth every 8 (eight) hours as needed for moderate pain.   lisinopril-hydrochlorothiazide (ZESTORETIC) 20-12.5 MG tablet Take 1 tablet by mouth daily for blood pressure   tadalafil (CIALIS) 20 MG tablet 1  tab 1 hour prior to intercourse   tizanidine (ZANAFLEX) 2 MG capsule Take 2 mg by mouth 3 (three) times daily.   traZODone (DESYREL) 50 MG tablet TAKE 1/2 TO 1 TABLET (25 TO 50 MG TOTAL) BY MOUTH AT BEDTIME   [DISCONTINUED] Dulaglutide 1.5 MG/0.5ML SOPN Inject 1.5 mg into the skin once a week.   No facility-administered encounter medications on file as of 03/29/2023.    Surgical History: Past Surgical History:  Procedure Laterality Date   colon cancer     COLON SURGERY      Medical History: Past Medical History:  Diagnosis Date   Diabetes mellitus without complication (HCC)    Hyperlipemia    Hypertension     Family History: Family History  Family history unknown: Yes    Social History   Socioeconomic History   Marital status: Married    Spouse name: Not on file   Number of children: Not on file   Years of education: Not on file   Highest education level: Not on file  Occupational History   Not on file  Tobacco Use   Smoking status: Some Days    Types: Cigars   Smokeless tobacco: Never   Tobacco comments:    3 a day  Vaping Use   Vaping Use: Never used  Substance and Sexual Activity   Alcohol use: Yes    Comment: Rarely   Drug use: No   Sexual activity:  Not on file  Other Topics Concern   Not on file  Social History Narrative   Not on file   Social Determinants of Health   Financial Resource Strain: Not on file  Food Insecurity: Not on file  Transportation Needs: Not on file  Physical Activity: Not on file  Stress: Not on file  Social Connections: Not on file  Intimate Partner Violence: Not on file      Review of Systems  Constitutional:  Negative for chills, fatigue and unexpected weight change.  HENT:  Negative for congestion, postnasal drip, rhinorrhea, sneezing and sore throat.   Eyes:  Negative for redness.  Respiratory:  Negative for cough, chest tightness and shortness of breath.   Cardiovascular:  Negative for chest pain and  palpitations.  Gastrointestinal:  Negative for abdominal pain, constipation, diarrhea, nausea and vomiting.  Genitourinary:  Negative for dysuria and frequency.  Musculoskeletal:  Negative for arthralgias, back pain, joint swelling and neck pain.  Skin:  Negative for rash.  Neurological: Negative.  Negative for tremors and numbness.  Hematological:  Negative for adenopathy. Does not bruise/bleed easily.  Psychiatric/Behavioral:  Negative for behavioral problems (Depression), sleep disturbance and suicidal ideas. The patient is not nervous/anxious.     Vital Signs: BP (!) 140/82   Pulse 82   Temp 97.8 F (36.6 C)   Resp 16   Ht 5\' 9"  (1.753 m)   Wt 173 lb (78.5 kg)   SpO2 99%   BMI 25.55 kg/m    Physical Exam Vitals and nursing note reviewed.  Constitutional:      General: He is not in acute distress.    Appearance: Normal appearance. He is well-developed and normal weight. He is not diaphoretic.  HENT:     Head: Normocephalic and atraumatic.     Mouth/Throat:     Pharynx: No oropharyngeal exudate.  Eyes:     Pupils: Pupils are equal, round, and reactive to light.  Neck:     Thyroid: No thyromegaly.     Vascular: No JVD.     Trachea: No tracheal deviation.  Cardiovascular:     Rate and Rhythm: Normal rate and regular rhythm.     Heart sounds: Normal heart sounds. No murmur heard.    No friction rub. No gallop.  Pulmonary:     Effort: Pulmonary effort is normal. No respiratory distress.     Breath sounds: No wheezing or rales.  Chest:     Chest wall: No tenderness.  Abdominal:     General: Bowel sounds are normal.     Palpations: Abdomen is soft.  Musculoskeletal:        General: Normal range of motion.     Cervical back: Normal range of motion and neck supple.  Lymphadenopathy:     Cervical: No cervical adenopathy.  Skin:    General: Skin is warm and dry.  Neurological:     Mental Status: He is alert and oriented to person, place, and time.     Cranial  Nerves: No cranial nerve deficit.  Psychiatric:        Behavior: Behavior normal.        Thought Content: Thought content normal.        Judgment: Judgment normal.        Assessment/Plan: 1. Uncontrolled type 2 diabetes mellitus with hyperglycemia (HCC) - POCT HgB A1C is 8.9 which is up from 8.6 last visit. Patient has been completely without trulicity for months and will restart on this and  continue other meds as before - POCT UA - Microalbumin - Dulaglutide (TRULICITY) 0.75 MG/0.5ML SOPN; Inject 0.75 mg into the skin once a week.  Dispense: 2 mL; Refill: 1  2. Essential hypertension Boredline in office, but has not had BP med yet this morning and will do so now. Continue to monitor   General Counseling: raider sisco understanding of the findings of todays visit and agrees with plan of treatment. I have discussed any further diagnostic evaluation that may be needed or ordered today. We also reviewed his medications today. he has been encouraged to call the office with any questions or concerns that should arise related to todays visit.    Orders Placed This Encounter  Procedures   POCT HgB A1C   POCT UA - Microalbumin    Meds ordered this encounter  Medications   Dulaglutide (TRULICITY) 0.75 MG/0.5ML SOPN    Sig: Inject 0.75 mg into the skin once a week.    Dispense:  2 mL    Refill:  1    This patient was seen by Lynn Ito, PA-C in collaboration with Dr. Beverely Risen as a part of collaborative care agreement.   Total time spent:30 Minutes Time spent includes review of chart, medications, test results, and follow up plan with the patient.      Dr Lyndon Code Internal medicine

## 2023-03-30 ENCOUNTER — Telehealth: Payer: Self-pay | Admitting: Physician Assistant

## 2023-03-30 NOTE — Telephone Encounter (Signed)
Lvm to schedule diabetic eye exam here at office-Toni 

## 2023-04-02 ENCOUNTER — Telehealth: Payer: Self-pay | Admitting: Physician Assistant

## 2023-04-02 NOTE — Telephone Encounter (Addendum)
s/w patient to schedule diabetic eye exam here at office. Patient stated they have done at regular eye doctor-Toni 

## 2023-04-08 NOTE — Telephone Encounter (Signed)
Erro

## 2023-04-22 ENCOUNTER — Ambulatory Visit: Payer: No Typology Code available for payment source | Admitting: Physician Assistant

## 2023-04-26 ENCOUNTER — Ambulatory Visit (INDEPENDENT_AMBULATORY_CARE_PROVIDER_SITE_OTHER): Payer: No Typology Code available for payment source | Admitting: Physician Assistant

## 2023-04-26 ENCOUNTER — Encounter: Payer: Self-pay | Admitting: Physician Assistant

## 2023-04-26 VITALS — BP 110/80 | HR 71 | Temp 98.1°F | Resp 16 | Ht 69.0 in | Wt 165.0 lb

## 2023-04-26 DIAGNOSIS — E1165 Type 2 diabetes mellitus with hyperglycemia: Secondary | ICD-10-CM

## 2023-04-26 DIAGNOSIS — I10 Essential (primary) hypertension: Secondary | ICD-10-CM | POA: Diagnosis not present

## 2023-04-26 MED ORDER — GLIMEPIRIDE 4 MG PO TABS
4.0000 mg | ORAL_TABLET | Freq: Every day | ORAL | 3 refills | Status: DC
Start: 2023-04-26 — End: 2023-05-21

## 2023-04-26 MED ORDER — GLIMEPIRIDE 2 MG PO TABS: ORAL_TABLET | ORAL | 1 refills | Status: DC

## 2023-04-26 NOTE — Progress Notes (Signed)
[facility] [address] [place], [place] [address]  Internal MEDICINE  Office Visit Note  Patient Name: Matthew Gay  [id number]  [id number]  Date of Service: 04/26/2023  Chief Complaint  Patient presents with   Follow-up   Hypertension   Diabetes   Hyperlipidemia    HPI Pt is here for routine follow up -Lost 8lbs since last visit  -Eating chicken most days as his source of protein and discussed ensuring he keeps his protein intake up, has been drinking more water -BG at home 200 after work this morning (works night shift), has not checked fasting and will start doing so -Still hasn't gotten his trulicity, again discussed this was sent last visit and to follow up with pharmacy to fill this -taking 2 tabs glimeride (4mg  total) and will send new script for higher dose -Missed his physical appt last week and will reschedule this  Current Medication: Outpatient Encounter Medications as of 04/26/2023  Medication Sig   acetaminophen-codeine (TYLENOL #3) 300-30 MG tablet Take 1 tablet by mouth every 6 (six) hours as needed for moderate pain or severe pain.   atorvastatin (LIPITOR) 20 MG tablet Take 1 tablet(s) by mouth at bedtime for cholesterol.   Blood Glucose Monitoring Suppl (ONE TOUCH ULTRA MINI) w/Device KIT Use as directed diagnosis e11.65   dapagliflozin propanediol (FARXIGA) 10 MG TABS tablet TAKE 1 TABLET BY MOUTH EVERY DAY BEFORE BREAKFAST   Dulaglutide (TRULICITY) 0.75 MG/0.5ML SOPN Inject 0.75 mg into the skin once a week.   gabapentin (NEURONTIN) 100 MG capsule TAKE ONE CAPSULE BY MOUTH TWICE A DAY FOR DIABETIC NEUROPATHY   glimepiride (AMARYL) 4 MG tablet Take 1 tablet (4 mg total) by mouth daily before breakfast.   glucose blood test strip 1 each by Other route daily. Check blood sugars once daily and as needed.  E11.65   ibuprofen (ADVIL) 800 MG tablet Take 1 tablet (800 mg total) by mouth every 8 (eight) hours as needed for moderate pain.    lisinopril-hydrochlorothiazide (ZESTORETIC) 20-12.5 MG tablet Take 1 tablet by mouth daily for blood pressure   tadalafil (CIALIS) 20 MG tablet 1 tab 1 hour prior to intercourse   tizanidine (ZANAFLEX) 2 MG capsule Take 2 mg by mouth 3 (three) times daily.   traZODone (DESYREL) 50 MG tablet TAKE 1/2 TO 1 TABLET (25 TO 50 MG TOTAL) BY MOUTH AT BEDTIME   [DISCONTINUED] glimepiride (AMARYL) 2 MG tablet Take 1 tablet by mouth daily with biggest meal of the day   [DISCONTINUED] glimepiride (AMARYL) 2 MG tablet Take 1 tablet by mouth daily with biggest meal of the day   No facility-administered encounter medications on file as of 04/26/2023.    Surgical History: Past Surgical History:  Procedure Laterality Date   colon cancer     COLON SURGERY      Medical History: Past Medical History:  Diagnosis Date   Diabetes mellitus without complication (HCC)    Hyperlipemia    Hypertension     Family History: Family History  Family history unknown: Yes    Social History   Socioeconomic History   Marital status: Married    Spouse name: Not on file   Number of children: Not on file   Years of education: Not on file   Highest education level: Not on file  Occupational History   Not on file  Tobacco Use   Smoking status: Some Days    Types: Cigars   Smokeless tobacco: Never   Tobacco  comments:    3 a day  Vaping Use   Vaping Use: Never used  Substance and Sexual Activity   Alcohol use: Yes    Comment: Rarely   Drug use: No   Sexual activity: Not on file  Other Topics Concern   Not on file  Social History Narrative   Not on file   Social Determinants of Health   Financial Resource Strain: Not on file  Food Insecurity: Not on file  Transportation Needs: Not on file  Physical Activity: Not on file  Stress: Not on file  Social Connections: Not on file  Intimate Partner Violence: Not on file      Review of Systems  Constitutional:  Positive for unexpected weight change.  Negative for chills and fatigue.  HENT:  Negative for congestion, postnasal drip, rhinorrhea, sneezing and sore throat.   Eyes:  Negative for redness.  Respiratory:  Negative for cough, chest tightness and shortness of breath.   Cardiovascular:  Negative for chest pain and palpitations.  Gastrointestinal:  Negative for abdominal pain, constipation, diarrhea, nausea and vomiting.  Genitourinary:  Negative for dysuria and frequency.  Musculoskeletal:  Negative for arthralgias, back pain, joint swelling and neck pain.  Skin:  Negative for rash.  Neurological: Negative.  Negative for tremors and numbness.  Hematological:  Negative for adenopathy. Does not bruise/bleed easily.  Psychiatric/Behavioral:  Negative for behavioral problems (Depression), sleep disturbance and suicidal ideas. The patient is not nervous/anxious.     Vital Signs: BP 110/80   Pulse 71   Temp 98.1 F (36.7 C)   Resp 16   Ht 5\' 9"  (1.753 m)   Wt 165 lb (74.8 kg)   SpO2 98%   BMI 24.37 kg/m    Physical Exam Vitals and nursing note reviewed.  Constitutional:      General: He is not in acute distress.    Appearance: Normal appearance. He is well-developed and normal weight. He is not diaphoretic.  HENT:     Head: Normocephalic and atraumatic.     Mouth/Throat:     Pharynx: No oropharyngeal exudate.  Eyes:     Pupils: Pupils are equal, round, and reactive to light.  Neck:     Thyroid: No thyromegaly.     Vascular: No JVD.     Trachea: No tracheal deviation.  Cardiovascular:     Rate and Rhythm: Normal rate and regular rhythm.     Heart sounds: Normal heart sounds. No murmur heard.    No friction rub. No gallop.  Pulmonary:     Effort: Pulmonary effort is normal. No respiratory distress.     Breath sounds: No wheezing or rales.  Chest:     Chest wall: No tenderness.  Abdominal:     General: Bowel sounds are normal.     Palpations: Abdomen is soft.  Musculoskeletal:        General: Normal range of  motion.     Cervical back: Normal range of motion and neck supple.  Lymphadenopathy:     Cervical: No cervical adenopathy.  Skin:    General: Skin is warm and dry.  Neurological:     Mental Status: He is alert and oriented to person, place, and time.     Cranial Nerves: No cranial nerve deficit.  Psychiatric:        Behavior: Behavior normal.        Thought Content: Thought content normal.        Judgment: Judgment normal.  Assessment/Plan: 1. Uncontrolled type 2 diabetes mellitus with hyperglycemia (HCC) Will call pharmacy regarding Trulicity to restart and will titrate up as indicated. Continue farxiga and glimepiride as before (new script sent for 1 tab of higher dose, still 4mg  total). Start checking fasting BG as well - glimepiride (AMARYL) 4 MG tablet; Take 1 tablet (4 mg total) by mouth daily before breakfast.  Dispense: 30 tablet; Refill: 3  2. Essential hypertension Stable, continue current medications   General Counseling: Aveer verbalizes understanding of the findings of todays visit and agrees with plan of treatment. I have discussed any further diagnostic evaluation that may be needed or ordered today. We also reviewed his medications today. he has been encouraged to call the office with any questions or concerns that should arise related to todays visit.    No orders of the defined types were placed in this encounter.   Meds ordered this encounter  Medications   DISCONTD: glimepiride (AMARYL) 2 MG tablet    Sig: Take 1 tablet by mouth daily with biggest meal of the day    Dispense:  90 tablet    Refill:  1    DX Code Needed  .   glimepiride (AMARYL) 4 MG tablet    Sig: Take 1 tablet (4 mg total) by mouth daily before breakfast.    Dispense:  30 tablet    Refill:  3    This patient was seen by Lynn Ito, PA-C in collaboration with Dr. Beverely Risen as a part of collaborative care agreement.   Total time spent:30 Minutes Time spent includes  review of chart, medications, test results, and follow up plan with the patient.      Dr Lyndon Code Internal medicine

## 2023-05-21 ENCOUNTER — Other Ambulatory Visit: Payer: Self-pay

## 2023-05-21 DIAGNOSIS — E1165 Type 2 diabetes mellitus with hyperglycemia: Secondary | ICD-10-CM

## 2023-05-21 MED ORDER — GLIMEPIRIDE 4 MG PO TABS
4.0000 mg | ORAL_TABLET | Freq: Every day | ORAL | 3 refills | Status: DC
Start: 2023-05-21 — End: 2024-04-27

## 2023-05-27 ENCOUNTER — Encounter: Payer: Self-pay | Admitting: Physician Assistant

## 2023-05-27 ENCOUNTER — Ambulatory Visit (INDEPENDENT_AMBULATORY_CARE_PROVIDER_SITE_OTHER): Payer: No Typology Code available for payment source | Admitting: Physician Assistant

## 2023-05-27 VITALS — BP 128/90 | HR 81 | Temp 98.1°F | Resp 16 | Ht 69.0 in | Wt 150.4 lb

## 2023-05-27 DIAGNOSIS — E1165 Type 2 diabetes mellitus with hyperglycemia: Secondary | ICD-10-CM

## 2023-05-27 DIAGNOSIS — R7989 Other specified abnormal findings of blood chemistry: Secondary | ICD-10-CM | POA: Diagnosis not present

## 2023-05-27 DIAGNOSIS — L602 Onychogryphosis: Secondary | ICD-10-CM | POA: Diagnosis not present

## 2023-05-27 DIAGNOSIS — Z0001 Encounter for general adult medical examination with abnormal findings: Secondary | ICD-10-CM | POA: Diagnosis not present

## 2023-05-27 DIAGNOSIS — Z85038 Personal history of other malignant neoplasm of large intestine: Secondary | ICD-10-CM

## 2023-05-27 DIAGNOSIS — Z1211 Encounter for screening for malignant neoplasm of colon: Secondary | ICD-10-CM | POA: Diagnosis not present

## 2023-05-27 DIAGNOSIS — R5383 Other fatigue: Secondary | ICD-10-CM | POA: Diagnosis not present

## 2023-05-27 DIAGNOSIS — I10 Essential (primary) hypertension: Secondary | ICD-10-CM

## 2023-05-27 DIAGNOSIS — E782 Mixed hyperlipidemia: Secondary | ICD-10-CM | POA: Diagnosis not present

## 2023-05-27 DIAGNOSIS — Z1212 Encounter for screening for malignant neoplasm of rectum: Secondary | ICD-10-CM

## 2023-05-27 DIAGNOSIS — Z125 Encounter for screening for malignant neoplasm of prostate: Secondary | ICD-10-CM | POA: Diagnosis not present

## 2023-05-27 DIAGNOSIS — R300 Dysuria: Secondary | ICD-10-CM | POA: Diagnosis not present

## 2023-05-27 DIAGNOSIS — M79675 Pain in left toe(s): Secondary | ICD-10-CM

## 2023-05-27 NOTE — Progress Notes (Signed)
[facility] [address] [place], [place] [address]  Internal MEDICINE  Office Visit Note  Patient Name: Matthew Gay  [id number]  [id number]  Date of Service: 06/02/2023  Chief Complaint  Patient presents with   Medicare Wellness   Diabetes   Hypertension   Hyperlipidemia     HPI Pt is here for routine health maintenance examination -BP at home not checked at home -BG at home 200 after eating will check fasting -Taking gilimepiride and farxiga, no trulicity. Will need alternative as he is unable to get trulicity -will have eye exam aug 28 -Would like podiatry referral to help with nail management and toe pain -very overdue for colonoscopy. He has hx of colon cancer and was supposed to have closer follow up but has been putting this off. Willing to have referral placed now  Current Medication: Outpatient Encounter Medications as of 05/27/2023  Medication Sig   acetaminophen-codeine (TYLENOL #3) 300-30 MG tablet Take 1 tablet by mouth every 6 (six) hours as needed for moderate pain or severe pain.   atorvastatin (LIPITOR) 20 MG tablet Take 1 tablet(s) by mouth at bedtime for cholesterol.   Blood Glucose Monitoring Suppl (ONE TOUCH ULTRA MINI) w/Device KIT Use as directed diagnosis e11.65   dapagliflozin propanediol (FARXIGA) 10 MG TABS tablet TAKE 1 TABLET BY MOUTH EVERY DAY BEFORE BREAKFAST   gabapentin (NEURONTIN) 100 MG capsule TAKE ONE CAPSULE BY MOUTH TWICE A DAY FOR DIABETIC NEUROPATHY   glimepiride (AMARYL) 4 MG tablet Take 1 tablet (4 mg total) by mouth daily before breakfast.   glucose blood test strip 1 each by Other route daily. Check blood sugars once daily and as needed.  E11.65   ibuprofen (ADVIL) 800 MG tablet Take 1 tablet (800 mg total) by mouth every 8 (eight) hours as needed for moderate pain.   lisinopril-hydrochlorothiazide (ZESTORETIC) 20-12.5 MG tablet Take 1 tablet by mouth daily for blood pressure   Semaglutide,0.25 or 0.5MG /DOS, 2  MG/3ML SOPN Inject 0.25 mg as directed once a week. May increase to .5mg  weekly after 4 weeks.   tadalafil (CIALIS) 20 MG tablet 1 tab 1 hour prior to intercourse   tizanidine (ZANAFLEX) 2 MG capsule Take 2 mg by mouth 3 (three) times daily.   traZODone (DESYREL) 50 MG tablet TAKE 1/2 TO 1 TABLET (25 TO 50 MG TOTAL) BY MOUTH AT BEDTIME   [DISCONTINUED] Dulaglutide (TRULICITY) 0.75 MG/0.5ML SOPN Inject 0.75 mg into the skin once a week.   No facility-administered encounter medications on file as of 05/27/2023.    Surgical History: Past Surgical History:  Procedure Laterality Date   colon cancer     COLON SURGERY      Medical History: Past Medical History:  Diagnosis Date   Diabetes mellitus without complication (HCC)    Hyperlipemia    Hypertension     Family History: Family History  Family history unknown: Yes      Review of Systems  Constitutional:  Negative for chills, fatigue and unexpected weight change.  HENT:  Negative for congestion, postnasal drip, rhinorrhea, sneezing and sore throat.   Eyes:  Negative for redness.  Respiratory:  Negative for cough, chest tightness and shortness of breath.   Cardiovascular:  Negative for chest pain and palpitations.  Gastrointestinal:  Negative for abdominal pain, constipation, diarrhea, nausea and vomiting.  Genitourinary:  Negative for dysuria and frequency.  Musculoskeletal:  Positive for arthralgias. Negative for back pain, joint swelling and neck pain.  Skin:  Negative for rash.  Neurological: Negative.  Negative for tremors and numbness.  Hematological:  Negative for adenopathy. Does not bruise/bleed easily.  Psychiatric/Behavioral:  Negative for behavioral problems (Depression), sleep disturbance and suicidal ideas. The patient is not nervous/anxious.      Vital Signs: BP (!) 128/90   Pulse 81   Temp 98.1 F (36.7 C)   Resp 16   Ht 5\' 9"  (1.753 m)   Wt 150 lb 6.4 oz (68.2 kg)   SpO2 97%   BMI 22.21 kg/m     Physical Exam Vitals and nursing note reviewed.  Constitutional:      General: He is not in acute distress.    Appearance: Normal appearance. He is well-developed and normal weight. He is not diaphoretic.  HENT:     Head: Normocephalic and atraumatic.     Mouth/Throat:     Pharynx: No oropharyngeal exudate.  Eyes:     Pupils: Pupils are equal, round, and reactive to light.  Neck:     Thyroid: No thyromegaly.     Vascular: No JVD.     Trachea: No tracheal deviation.  Cardiovascular:     Rate and Rhythm: Normal rate and regular rhythm.     Heart sounds: Normal heart sounds. No murmur heard.    No friction rub. No gallop.  Pulmonary:     Effort: Pulmonary effort is normal. No respiratory distress.     Breath sounds: No wheezing or rales.  Chest:     Chest wall: No tenderness.  Abdominal:     General: Bowel sounds are normal.     Palpations: Abdomen is soft.     Tenderness: There is no abdominal tenderness.  Musculoskeletal:        General: Normal range of motion.     Cervical back: Normal range of motion and neck supple.  Feet:     Right foot:     Protective Sensation: 2 sites tested.  2 sites sensed.     Toenail Condition: Right toenails are abnormally thick and long.     Left foot:     Protective Sensation: 2 sites tested.  2 sites sensed.     Toenail Condition: Left toenails are abnormally thick and long.  Lymphadenopathy:     Cervical: No cervical adenopathy.  Skin:    General: Skin is warm and dry.  Neurological:     Mental Status: He is alert and oriented to person, place, and time.     Cranial Nerves: No cranial nerve deficit.  Psychiatric:        Behavior: Behavior normal.        Thought Content: Thought content normal.        Judgment: Judgment normal.      LABS: Recent Results (from the past 2160 hour(s))  POCT UA - Microalbumin     Status: None   Collection Time: 03/29/23  9:36 AM  Result Value Ref Range   Microalbumin Ur, POC 30 mg/L    Creatinine, POC 100 mg/dL   Albumin/Creatinine Ratio, Urine, POC <30   POCT HgB A1C     Status: Abnormal   Collection Time: 03/29/23  9:39 AM  Result Value Ref Range   Hemoglobin A1C 8.9 (A) 4.0 - 5.6 %   HbA1c POC (<> result, manual entry)     HbA1c, POC (prediabetic range)     HbA1c, POC (controlled diabetic range)    UA/M w/rflx Culture, Routine     Status: Abnormal   Collection Time: 05/27/23  3:50 PM  Specimen: Urine   Urine  Result Value Ref Range   Specific Gravity, UA 1.029 1.005 - 1.030   pH, UA 6.0 5.0 - 7.5   Color, UA Yellow Yellow   Appearance Ur Clear Clear   Leukocytes,UA Negative Negative   Protein,UA Negative Negative/Trace   Glucose, UA 3+ (A) Negative   Ketones, UA Negative Negative   RBC, UA Negative Negative   Bilirubin, UA Negative Negative   Urobilinogen, Ur 1.0 0.2 - 1.0 mg/dL   Nitrite, UA Negative Negative   Microscopic Examination Comment     Comment: Microscopic follows if indicated.   Microscopic Examination See below:     Comment: Microscopic was indicated and was performed.   Urinalysis Reflex Comment     Comment: This specimen will not reflex to a Urine Culture.  Microscopic Examination     Status: None   Collection Time: 05/27/23  3:50 PM   Urine  Result Value Ref Range   WBC, UA None seen 0 - 5 /hpf   RBC, Urine None seen 0 - 2 /hpf   Epithelial Cells (non renal) None seen 0 - 10 /hpf   Casts None seen None seen /lpf   Bacteria, UA None seen None seen/Few        Assessment/Plan: 1. Encounter for general adult medical examination with abnormal findings CPE performed, labs ordered, foot exam done, eye exam scheduled, due for colonoscopy  2. Uncontrolled type 2 diabetes mellitus with hyperglycemia (HCC) Will try switching to ozempic, and start monitoring sugars more closely - Ambulatory referral to Podiatry - Semaglutide,0.25 or 0.5MG /DOS, 2 MG/3ML SOPN; Inject 0.25 mg as directed once a week. May increase to .5mg  weekly after 4  weeks.  Dispense: 3 mL; Refill: 2  3. Essential hypertension Borderline in office, start checking at home  4. Mixed hyperlipidemia - Lipid Panel With LDL/HDL Ratio  5. Abnormal thyroid blood test - TSH + free T4  6. Screening for prostate cancer - PSA Total (Reflex To Free)  7. Screening for colorectal cancer - Ambulatory referral to Gastroenterology  8. History of colon cancer - Ambulatory referral to Gastroenterology  9. Pain of toe of left foot - Ambulatory referral to Podiatry  10. Hypertrophic toenail - Ambulatory referral to Podiatry  11. Other fatigue - CBC w/Diff/Platelet - Comprehensive metabolic panel - TSH + free T4 - Lipid Panel With LDL/HDL Ratio  12. Dysuria - UA/M w/rflx Culture, Routine - Microscopic Examination   General Counseling: Kwinton verbalizes understanding of the findings of todays visit and agrees with plan of treatment. I have discussed any further diagnostic evaluation that may be needed or ordered today. We also reviewed his medications today. he has been encouraged to call the office with any questions or concerns that should arise related to todays visit.    Counseling:    Orders Placed This Encounter  Procedures   Microscopic Examination   UA/M w/rflx Culture, Routine   CBC w/Diff/Platelet   Comprehensive metabolic panel   TSH + free T4   Lipid Panel With LDL/HDL Ratio   PSA Total (Reflex To Free)   Ambulatory referral to Gastroenterology   Ambulatory referral to Podiatry    Meds ordered this encounter  Medications   Semaglutide,0.25 or 0.5MG /DOS, 2 MG/3ML SOPN    Sig: Inject 0.25 mg as directed once a week. May increase to .5mg  weekly after 4 weeks.    Dispense:  3 mL    Refill:  2    This patient was seen  by Lynn Ito, PA-C in collaboration with Dr. Beverely Risen as a part of collaborative care agreement.  Total time spent:35 Minutes  Time spent includes review of chart, medications, test results, and follow  up plan with the patient.     Lyndon Code, MD  Internal Medicine

## 2023-05-28 LAB — MICROSCOPIC EXAMINATION
Bacteria, UA: NONE SEEN
Casts: NONE SEEN /lpf
Epithelial Cells (non renal): NONE SEEN /hpf (ref 0–10)
RBC, Urine: NONE SEEN /hpf (ref 0–2)
WBC, UA: NONE SEEN /hpf (ref 0–5)

## 2023-05-28 LAB — UA/M W/RFLX CULTURE, ROUTINE
Bilirubin, UA: NEGATIVE
Ketones, UA: NEGATIVE
Leukocytes,UA: NEGATIVE
Nitrite, UA: NEGATIVE
Protein,UA: NEGATIVE
RBC, UA: NEGATIVE
Specific Gravity, UA: 1.029 (ref 1.005–1.030)
Urobilinogen, Ur: 1 mg/dL (ref 0.2–1.0)
pH, UA: 6 (ref 5.0–7.5)

## 2023-06-02 MED ORDER — SEMAGLUTIDE(0.25 OR 0.5MG/DOS) 2 MG/3ML ~~LOC~~ SOPN
0.2500 mg | PEN_INJECTOR | SUBCUTANEOUS | 2 refills | Status: DC
Start: 2023-06-02 — End: 2024-05-23

## 2023-06-07 ENCOUNTER — Encounter: Payer: Self-pay | Admitting: *Deleted

## 2023-06-14 ENCOUNTER — Other Ambulatory Visit: Payer: Self-pay

## 2023-06-14 DIAGNOSIS — E782 Mixed hyperlipidemia: Secondary | ICD-10-CM

## 2023-06-14 MED ORDER — ATORVASTATIN CALCIUM 20 MG PO TABS
ORAL_TABLET | ORAL | 1 refills | Status: DC
Start: 2023-06-14 — End: 2024-06-19

## 2023-06-15 ENCOUNTER — Ambulatory Visit: Payer: No Typology Code available for payment source | Admitting: Podiatry

## 2023-06-15 DIAGNOSIS — E782 Mixed hyperlipidemia: Secondary | ICD-10-CM | POA: Diagnosis not present

## 2023-06-15 DIAGNOSIS — R7989 Other specified abnormal findings of blood chemistry: Secondary | ICD-10-CM | POA: Diagnosis not present

## 2023-06-15 DIAGNOSIS — Z125 Encounter for screening for malignant neoplasm of prostate: Secondary | ICD-10-CM | POA: Diagnosis not present

## 2023-06-15 DIAGNOSIS — R5383 Other fatigue: Secondary | ICD-10-CM | POA: Diagnosis not present

## 2023-06-16 ENCOUNTER — Other Ambulatory Visit: Payer: Self-pay | Admitting: Physician Assistant

## 2023-06-16 DIAGNOSIS — N289 Disorder of kidney and ureter, unspecified: Secondary | ICD-10-CM

## 2023-06-18 ENCOUNTER — Telehealth: Payer: Self-pay

## 2023-06-18 NOTE — Telephone Encounter (Signed)
-----   Message from Carlean Jews sent at 06/16/2023 10:23 AM EDT ----- Please let pt know his cholesterol went up slightly and to work on this. His kidney function was also abnormal on labs and I want to recheck this in a few weeks to monitor--lab ordered. Please also check if he has heard anything about getting ozempic (this is the alternative to trulicity I sent for him when he could not get Trulicity anymore)

## 2023-06-18 NOTE — Telephone Encounter (Signed)
Left message for patient regarding lab results. 

## 2023-07-12 ENCOUNTER — Telehealth: Payer: Self-pay | Admitting: Physician Assistant

## 2023-07-12 NOTE — Telephone Encounter (Signed)
Pt's heath insurance called wanting to confirm phone wasn't able to reach pt regarding medication Lipitor gave secondary number to [facility], then called pt to verify if pt needs refill on medication pt stated that he did not need refill

## 2023-07-13 ENCOUNTER — Other Ambulatory Visit: Payer: Self-pay

## 2023-07-13 DIAGNOSIS — I10 Essential (primary) hypertension: Secondary | ICD-10-CM

## 2023-07-13 MED ORDER — LISINOPRIL-HYDROCHLOROTHIAZIDE 20-12.5 MG PO TABS
ORAL_TABLET | ORAL | 1 refills | Status: DC
Start: 2023-07-13 — End: 2024-04-24

## 2023-07-14 DIAGNOSIS — H524 Presbyopia: Secondary | ICD-10-CM | POA: Diagnosis not present

## 2023-07-14 DIAGNOSIS — H35033 Hypertensive retinopathy, bilateral: Secondary | ICD-10-CM | POA: Diagnosis not present

## 2023-07-14 DIAGNOSIS — Z0100 Encounter for examination of eyes and vision without abnormal findings: Secondary | ICD-10-CM | POA: Diagnosis not present

## 2023-07-28 ENCOUNTER — Encounter: Payer: Self-pay | Admitting: *Deleted

## 2023-07-29 ENCOUNTER — Ambulatory Visit: Payer: Medicare PPO | Admitting: Physician Assistant

## 2023-07-29 ENCOUNTER — Encounter: Payer: Self-pay | Admitting: Physician Assistant

## 2023-07-29 VITALS — BP 130/80 | HR 74 | Temp 98.2°F | Resp 16 | Ht 69.0 in | Wt 163.0 lb

## 2023-07-29 DIAGNOSIS — Z23 Encounter for immunization: Secondary | ICD-10-CM

## 2023-07-29 DIAGNOSIS — N522 Drug-induced erectile dysfunction: Secondary | ICD-10-CM | POA: Diagnosis not present

## 2023-07-29 DIAGNOSIS — E1165 Type 2 diabetes mellitus with hyperglycemia: Secondary | ICD-10-CM | POA: Diagnosis not present

## 2023-07-29 DIAGNOSIS — I10 Essential (primary) hypertension: Secondary | ICD-10-CM | POA: Diagnosis not present

## 2023-07-29 DIAGNOSIS — E782 Mixed hyperlipidemia: Secondary | ICD-10-CM

## 2023-07-29 LAB — POCT GLYCOSYLATED HEMOGLOBIN (HGB A1C): Hemoglobin A1C: 9.5 % — AB (ref 4.0–5.6)

## 2023-07-29 MED ORDER — TADALAFIL 20 MG PO TABS
ORAL_TABLET | ORAL | 0 refills | Status: DC
Start: 2023-07-29 — End: 2024-05-23

## 2023-07-29 NOTE — Progress Notes (Signed)
[facility] [address] [place], [place] [address]  Internal MEDICINE  Office Visit Note  Patient Name: Matthew Gay  [id number]  [id number]  Date of Service: 07/29/2023  Chief Complaint  Patient presents with   Follow-up   Diabetes   Hypertension   Hyperlipidemia    HPI Pt is here for routine follow up -Will need new Marcelline Deist pt assistance in a few months--states he got an email about this but he has plenty right now. Advised to call when 3 weeks left to follow up on status to see if we have received renewal forms -has not started on ozempic and will get this at end of the month due to cost timing. Discussed if he cannot get this to let us know -warned insulin will be needed if sugar not improving and he is motivated to avoid this -taking lipitor when he thinks about it and will do better about taking daily since LDL did rise some -eye exam was on 28th  Current Medication: Outpatient Encounter Medications as of 07/29/2023  Medication Sig   acetaminophen-codeine (TYLENOL #3) 300-30 MG tablet Take 1 tablet by mouth every 6 (six) hours as needed for moderate pain or severe pain.   atorvastatin (LIPITOR) 20 MG tablet Take 1 tablet(s) by mouth at bedtime for cholesterol.   Blood Glucose Monitoring Suppl (ONE TOUCH ULTRA MINI) w/Device KIT Use as directed diagnosis e11.65   dapagliflozin propanediol (FARXIGA) 10 MG TABS tablet TAKE 1 TABLET BY MOUTH EVERY DAY BEFORE BREAKFAST   gabapentin (NEURONTIN) 100 MG capsule TAKE ONE CAPSULE BY MOUTH TWICE A DAY FOR DIABETIC NEUROPATHY   glimepiride (AMARYL) 4 MG tablet Take 1 tablet (4 mg total) by mouth daily before breakfast.   glucose blood test strip 1 each by Other route daily. Check blood sugars once daily and as needed.  E11.65   ibuprofen (ADVIL) 800 MG tablet Take 1 tablet (800 mg total) by mouth every 8 (eight) hours as needed for moderate pain.   lisinopril-hydrochlorothiazide (ZESTORETIC) 20-12.5 MG tablet Take 1  tablet by mouth daily for blood pressure   Semaglutide,0.25 or 0.5MG /DOS, 2 MG/3ML SOPN Inject 0.25 mg as directed once a week. May increase to .5mg  weekly after 4 weeks.   tizanidine (ZANAFLEX) 2 MG capsule Take 2 mg by mouth 3 (three) times daily.   traZODone (DESYREL) 50 MG tablet TAKE 1/2 TO 1 TABLET (25 TO 50 MG TOTAL) BY MOUTH AT BEDTIME   [DISCONTINUED] tadalafil (CIALIS) 20 MG tablet 1 tab 1 hour prior to intercourse   tadalafil (CIALIS) 20 MG tablet 1 tab 1 hour prior to intercourse   No facility-administered encounter medications on file as of 07/29/2023.    Surgical History: Past Surgical History:  Procedure Laterality Date   colon cancer     COLON SURGERY      Medical History: Past Medical History:  Diagnosis Date   Diabetes mellitus without complication (HCC)    Hyperlipemia    Hypertension     Family History: Family History  Family history unknown: Yes    Social History   Socioeconomic History   Marital status: Married    Spouse name: Not on file   Number of children: Not on file   Years of education: Not on file   Highest education level: Not on file  Occupational History   Not on file  Tobacco Use   Smoking status: Some Days    Types: Cigars   Smokeless tobacco: Never   Tobacco comments:  1/2 cigarette daily.  Vaping Use   Vaping status: Never Used  Substance and Sexual Activity   Alcohol use: Yes    Comment: Rarely   Drug use: No   Sexual activity: Not on file  Other Topics Concern   Not on file  Social History Narrative   Not on file   Social Determinants of Health   Financial Resource Strain: Not on file  Food Insecurity: Not on file  Transportation Needs: Not on file  Physical Activity: Not on file  Stress: Not on file  Social Connections: Not on file  Intimate Partner Violence: Not on file      Review of Systems  Constitutional:  Negative for chills, fatigue and unexpected weight change.  HENT:  Negative for congestion,  postnasal drip, rhinorrhea, sneezing and sore throat.   Eyes:  Negative for redness.  Respiratory:  Negative for cough, chest tightness and shortness of breath.   Cardiovascular:  Negative for chest pain and palpitations.  Gastrointestinal:  Negative for abdominal pain, constipation, diarrhea, nausea and vomiting.  Genitourinary:  Negative for dysuria and frequency.       ED  Musculoskeletal:  Positive for arthralgias. Negative for back pain, joint swelling and neck pain.  Skin:  Negative for rash.  Neurological: Negative.  Negative for tremors and numbness.  Hematological:  Negative for adenopathy. Does not bruise/bleed easily.  Psychiatric/Behavioral:  Negative for behavioral problems (Depression), sleep disturbance and suicidal ideas. The patient is not nervous/anxious.     Vital Signs: BP 130/80   Pulse 74   Temp 98.2 F (36.8 C)   Resp 16   Ht 5\' 9"  (1.753 m)   Wt 163 lb (73.9 kg)   SpO2 98%   BMI 24.07 kg/m    Physical Exam Vitals and nursing note reviewed.  Constitutional:      General: He is not in acute distress.    Appearance: Normal appearance. He is well-developed and normal weight. He is not diaphoretic.  HENT:     Head: Normocephalic and atraumatic.     Mouth/Throat:     Pharynx: No oropharyngeal exudate.  Eyes:     Pupils: Pupils are equal, round, and reactive to light.  Neck:     Thyroid: No thyromegaly.     Vascular: No JVD.     Trachea: No tracheal deviation.  Cardiovascular:     Rate and Rhythm: Normal rate and regular rhythm.     Heart sounds: Normal heart sounds. No murmur heard.    No friction rub. No gallop.  Pulmonary:     Effort: Pulmonary effort is normal. No respiratory distress.     Breath sounds: No wheezing or rales.  Chest:     Chest wall: No tenderness.  Abdominal:     General: Bowel sounds are normal.     Palpations: Abdomen is soft.  Musculoskeletal:        General: Normal range of motion.     Cervical back: Normal range of  motion and neck supple.  Lymphadenopathy:     Cervical: No cervical adenopathy.  Skin:    General: Skin is warm and dry.  Neurological:     Mental Status: He is alert and oriented to person, place, and time.     Cranial Nerves: No cranial nerve deficit.  Psychiatric:        Behavior: Behavior normal.        Thought Content: Thought content normal.        Judgment: Judgment normal.  Assessment/Plan: 1. Type 2 diabetes mellitus with hyperglycemia, without long-term current use of insulin (HCC) - POCT HgB A1C is 9.5 which is increased from 8.9 last visit. Has not had GLP1 in awhile due to backorder and need for change. Will be able to fill ozempic at end of the month after he gets paid and will move forward with titrating this. Continue other medications as before. Did mention insulin would be next option and he wants to avoid this  2. Essential hypertension Controlled, continue current medication  3. Drug-induced erectile dysfunction - tadalafil (CIALIS) 20 MG tablet; 1 tab 1 hour prior to intercourse  Dispense: 10 tablet; Refill: 0  4. Mixed hyperlipidemia Take lipitor daily  5. Flu vaccine need - Flu vaccine, recombinant, trivalent, inj   General Counseling: Ihan verbalizes understanding of the findings of todays visit and agrees with plan of treatment. I have discussed any further diagnostic evaluation that may be needed or ordered today. We also reviewed his medications today. he has been encouraged to call the office with any questions or concerns that should arise related to todays visit.    Orders Placed This Encounter  Procedures   Flu vaccine, recombinant, trivalent, inj   POCT HgB A1C    Meds ordered this encounter  Medications   tadalafil (CIALIS) 20 MG tablet    Sig: 1 tab 1 hour prior to intercourse    Dispense:  10 tablet    Refill:  0    This patient was seen by Lynn Ito, PA-C in collaboration with Dr. Beverely Risen as a part of  collaborative care agreement.   Total time spent:30 Minutes Time spent includes review of chart, medications, test results, and follow up plan with the patient.      Dr Lyndon Code Internal medicine

## 2023-09-22 ENCOUNTER — Telehealth: Payer: Self-pay

## 2023-09-22 NOTE — Telephone Encounter (Signed)
Left message for patient to give office a call back

## 2023-09-30 ENCOUNTER — Encounter: Payer: Self-pay | Admitting: Physician Assistant

## 2023-09-30 ENCOUNTER — Ambulatory Visit (INDEPENDENT_AMBULATORY_CARE_PROVIDER_SITE_OTHER): Payer: Medicare PPO | Admitting: Physician Assistant

## 2023-09-30 VITALS — BP 120/73 | HR 74 | Temp 98.2°F | Resp 16 | Ht 69.0 in | Wt 168.2 lb

## 2023-09-30 DIAGNOSIS — I10 Essential (primary) hypertension: Secondary | ICD-10-CM | POA: Diagnosis not present

## 2023-09-30 DIAGNOSIS — E1165 Type 2 diabetes mellitus with hyperglycemia: Secondary | ICD-10-CM | POA: Diagnosis not present

## 2023-09-30 DIAGNOSIS — N289 Disorder of kidney and ureter, unspecified: Secondary | ICD-10-CM | POA: Diagnosis not present

## 2023-09-30 NOTE — Progress Notes (Signed)
[facility] [address] [place], [place] [address]  Internal MEDICINE  Office Visit Note  Patient Name: Matthew Gay  [id number]  [id number]  Date of Service: 10/06/2023  Chief Complaint  Patient presents with   Follow-up   Diabetes   Hypertension   Hyperlipidemia   Quality Metric Gaps    Colonoscopy    HPI Pt is here for routine follow up -Pt assistance for Marcelline Deist renewal due and working on this -Will try to pick up ozempic tomorrow. Never went to get it previously. Discussed A1c not controlled and need for improvement, especially given renal function decline. Informed other option would be insulin and pt does not want to do this. Will get ozempic filled now -will discuss colonoscopy in new year, declines for now -BP stable   Current Medication: Outpatient Encounter Medications as of 09/30/2023  Medication Sig   acetaminophen-codeine (TYLENOL #3) 300-30 MG tablet Take 1 tablet by mouth every 6 (six) hours as needed for moderate pain or severe pain.   atorvastatin (LIPITOR) 20 MG tablet Take 1 tablet(s) by mouth at bedtime for cholesterol.   Blood Glucose Monitoring Suppl (ONE TOUCH ULTRA MINI) w/Device KIT Use as directed diagnosis e11.65   dapagliflozin propanediol (FARXIGA) 10 MG TABS tablet TAKE 1 TABLET BY MOUTH EVERY DAY BEFORE BREAKFAST   gabapentin (NEURONTIN) 100 MG capsule TAKE ONE CAPSULE BY MOUTH TWICE A DAY FOR DIABETIC NEUROPATHY   glimepiride (AMARYL) 4 MG tablet Take 1 tablet (4 mg total) by mouth daily before breakfast.   glucose blood test strip 1 each by Other route daily. Check blood sugars once daily and as needed.  E11.65   ibuprofen (ADVIL) 800 MG tablet Take 1 tablet (800 mg total) by mouth every 8 (eight) hours as needed for moderate pain.   lisinopril-hydrochlorothiazide (ZESTORETIC) 20-12.5 MG tablet Take 1 tablet by mouth daily for blood pressure   Semaglutide,0.25 or 0.5MG /DOS, 2 MG/3ML SOPN Inject 0.25 mg as directed once a week.  May increase to .5mg  weekly after 4 weeks.   tadalafil (CIALIS) 20 MG tablet 1 tab 1 hour prior to intercourse   tizanidine (ZANAFLEX) 2 MG capsule Take 2 mg by mouth 3 (three) times daily.   traZODone (DESYREL) 50 MG tablet TAKE 1/2 TO 1 TABLET (25 TO 50 MG TOTAL) BY MOUTH AT BEDTIME   No facility-administered encounter medications on file as of 09/30/2023.    Surgical History: Past Surgical History:  Procedure Laterality Date   colon cancer     COLON SURGERY      Medical History: Past Medical History:  Diagnosis Date   Diabetes mellitus without complication (HCC)    Hyperlipemia    Hypertension     Family History: Family History  Family history unknown: Yes    Social History   Socioeconomic History   Marital status: Married    Spouse name: Not on file   Number of children: Not on file   Years of education: Not on file   Highest education level: Not on file  Occupational History   Not on file  Tobacco Use   Smoking status: Some Days    Types: Cigars   Smokeless tobacco: Never   Tobacco comments:    1/2 cigarette daily.  Vaping Use   Vaping status: Never Used  Substance and Sexual Activity   Alcohol use: Yes    Comment: Rarely   Drug use: No   Sexual activity: Not on file  Other Topics Concern   Not  on file  Social History Narrative   Not on file   Social Determinants of Health   Financial Resource Strain: Not on file  Food Insecurity: Not on file  Transportation Needs: Not on file  Physical Activity: Not on file  Stress: Not on file  Social Connections: Not on file  Intimate Partner Violence: Not on file      Review of Systems  Constitutional:  Negative for chills, fatigue and unexpected weight change.  HENT:  Negative for congestion, postnasal drip, rhinorrhea, sneezing and sore throat.   Eyes:  Negative for redness.  Respiratory:  Negative for cough, chest tightness and shortness of breath.   Cardiovascular:  Negative for chest pain and  palpitations.  Gastrointestinal:  Negative for abdominal pain, constipation, diarrhea, nausea and vomiting.  Genitourinary:  Negative for dysuria and frequency.  Musculoskeletal:  Positive for arthralgias. Negative for back pain, joint swelling and neck pain.  Skin:  Negative for rash.  Neurological: Negative.  Negative for tremors and numbness.  Hematological:  Negative for adenopathy. Does not bruise/bleed easily.  Psychiatric/Behavioral:  Negative for behavioral problems (Depression), sleep disturbance and suicidal ideas. The patient is not nervous/anxious.     Vital Signs: BP 120/73   Pulse 74   Temp 98.2 F (36.8 C)   Resp 16   Ht 5\' 9"  (1.753 m)   Wt 168 lb 3.2 oz (76.3 kg)   SpO2 97%   BMI 24.84 kg/m    Physical Exam Vitals and nursing note reviewed.  Constitutional:      General: He is not in acute distress.    Appearance: Normal appearance. He is well-developed and normal weight. He is not diaphoretic.  HENT:     Head: Normocephalic and atraumatic.     Mouth/Throat:     Pharynx: No oropharyngeal exudate.  Eyes:     Pupils: Pupils are equal, round, and reactive to light.  Neck:     Thyroid: No thyromegaly.     Vascular: No JVD.     Trachea: No tracheal deviation.  Cardiovascular:     Rate and Rhythm: Normal rate and regular rhythm.     Heart sounds: Normal heart sounds. No murmur heard.    No friction rub. No gallop.  Pulmonary:     Effort: Pulmonary effort is normal. No respiratory distress.     Breath sounds: No wheezing or rales.  Chest:     Chest wall: No tenderness.  Abdominal:     General: Bowel sounds are normal.     Palpations: Abdomen is soft.  Musculoskeletal:        General: Normal range of motion.     Cervical back: Normal range of motion and neck supple.  Lymphadenopathy:     Cervical: No cervical adenopathy.  Skin:    General: Skin is warm and dry.  Neurological:     Mental Status: He is alert and oriented to person, place, and time.      Cranial Nerves: No cranial nerve deficit.  Psychiatric:        Behavior: Behavior normal.        Thought Content: Thought content normal.        Judgment: Judgment normal.        Assessment/Plan: 1. Type 2 diabetes mellitus with hyperglycemia, without long-term current use of insulin (HCC) Very uncontrolled and did not pick up new medication. Will do so now. If not able to pick up then insulin will be prescribed and pt wants to avoid  this and will do better with med compliance and diet/exercise  2. Essential hypertension Stable, continue current medication  3. Abnormal kidney function Lab for recheck ordered last visit and pt will have this done now    General Counseling: Matthew Gay verbalizes understanding of the findings of todays visit and agrees with plan of treatment. I have discussed any further diagnostic evaluation that may be needed or ordered today. We also reviewed his medications today. he has been encouraged to call the office with any questions or concerns that should arise related to todays visit.    No orders of the defined types were placed in this encounter.   No orders of the defined types were placed in this encounter.   This patient was seen by Lynn Ito, PA-C in collaboration with Dr. Beverely Risen as a part of collaborative care agreement.   Total time spent:30 Minutes Time spent includes review of chart, medications, test results, and follow up plan with the patient.      Dr Lyndon Code Internal medicine

## 2023-10-05 ENCOUNTER — Telehealth: Payer: Self-pay

## 2023-10-05 LAB — BASIC METABOLIC PANEL
BUN/Creatinine Ratio: 15 (ref 10–24)
BUN: 21 mg/dL (ref 8–27)
CO2: 23 mmol/L (ref 20–29)
Calcium: 10.1 mg/dL (ref 8.6–10.2)
Chloride: 99 mmol/L (ref 96–106)
Creatinine, Ser: 1.36 mg/dL — ABNORMAL HIGH (ref 0.76–1.27)
Glucose: 375 mg/dL — ABNORMAL HIGH (ref 70–99)
Potassium: 4.4 mmol/L (ref 3.5–5.2)
Sodium: 136 mmol/L (ref 134–144)
eGFR: 55 mL/min/{1.73_m2} — ABNORMAL LOW (ref >59)

## 2023-10-05 NOTE — Telephone Encounter (Signed)
Spoke with patient regarding lab results. Sent MyChart message as well because patient is hard of hearing.

## 2023-10-05 NOTE — Telephone Encounter (Signed)
-----   Message from Carlean Jews sent at 10/05/2023 10:42 AM EST ----- Please let him know kidney function is still decreased and may correlate with worsening sugar. I would like to order renal US if he is ok with it. His sugar was 375 when labs drawn which is very elevated. Please see if he picked up the ozempic/got started on it now. If not then we need to adjust meds and consider insulin. Also follow up on Farxiga pt assistance--I think Vanice Sarah was trying to reach him so he can follow steps to renew on his phone.

## 2023-10-28 ENCOUNTER — Ambulatory Visit: Payer: Medicare PPO | Admitting: Physician Assistant

## 2023-12-20 ENCOUNTER — Telehealth: Payer: Self-pay

## 2023-12-20 DIAGNOSIS — Z79899 Other long term (current) drug therapy: Secondary | ICD-10-CM

## 2023-12-20 NOTE — Progress Notes (Signed)
   12/20/2023  Patient ID: Matthew Gay, male   DOB: 27-Feb-1951, 73 y.o.   MRN: [id number]  Patient scheduled for medication review/management for diabetes, related to [employer], for an appointment on 12/24/2023 @ 1 pm.  Thank you for allowing pharmacy to be a part of this patient's care.  Cephus Shelling, PharmD Clinical Pharmacist [facility] Cell: [phone]

## 2023-12-24 ENCOUNTER — Telehealth: Payer: Self-pay

## 2023-12-24 DIAGNOSIS — Z9189 Other specified personal risk factors, not elsewhere classified: Secondary | ICD-10-CM

## 2023-12-24 DIAGNOSIS — Z79899 Other long term (current) drug therapy: Secondary | ICD-10-CM

## 2023-12-24 NOTE — Progress Notes (Signed)
   12/24/2023  Patient ID: Matthew Gay, male   DOB: [birth year], 73 y.o.   MRN: [id number]  Attempted to contact patient for medication management/review. Left HIPAA compliant message for patient to return my call at their convenience.   First attempt for patient outreach. Will follow up with patient in 3-4 business days.  Thank you for allowing pharmacy to be a part of this patient's care.  Dorcas Solian, PharmD Clinical Pharmacist Cell: [phone]

## 2023-12-30 ENCOUNTER — Other Ambulatory Visit: Payer: Self-pay

## 2023-12-30 DIAGNOSIS — Z79899 Other long term (current) drug therapy: Secondary | ICD-10-CM

## 2023-12-30 NOTE — Progress Notes (Signed)
12/30/2023 Name: Matthew Gay MRN: [id number] DOB: 1951-09-07  Chief Complaint  Patient presents with   Diabetes    TNM    Matthew Gay is a 73 y.o. year old male who presented for a telephone visit.   They were referred to the pharmacist by a quality report for assistance in managing diabetes. HIPAA identifiers confirmed with DOB and he  was available to discuss diabetes management inquiry.    Subjective: Mr. Matthew Gay is a 73 year old male, with a PMH significant for type 2 diabetes and hypertension.Patient was identified on the [employer] report for uncontrolled diabetes.   Care Team: Primary Care Provider: Alan Ripper ; Next Scheduled Visit: 05/29/2024   Medication Access/Adherence  Current Pharmacy:  [facility] [address] [place] - [address] [place] [address] Phone: [phone] Fax: [phone]  [facility] - [place], [place] - [address] [place] [address] Phone: [phone] Fax: [phone]  [facility] [address] ([address]), [place] - [address] [place] ([address]) [place] [address] Phone: [phone] Fax: [phone]  [id number] [place], [place] - [address] [place] [address] Phone: [phone] Fax: [phone]   Patient reports affordability concerns with their medications: Yes  Marcelline Deist and Ozempic   Patient reports access/transportation concerns to their pharmacy: No  Patient reports adherence concerns with their medications:  No     Diabetes: During the call, patient eventually stated it was not a good time to talk and to call him back tomorrow (or another day) after noon. Therefore, we were unable to address his diabetes regimen and management.    Objective:  Lab Results  Component Value Date   HGBA1C 9.5 (A) 07/29/2023    Lab Results  Component Value  Date   CREATININE 1.36 (H) 10/04/2023   BUN 21 10/04/2023   NA 136 10/04/2023   K 4.4 10/04/2023   CL 99 10/04/2023   CO2 23 10/04/2023    Lab Results  Component Value Date   CHOL 172 06/15/2023   HDL 46 06/15/2023   LDLCALC 107 (H) 06/15/2023   TRIG 105 06/15/2023    Medications Reviewed Today     Reviewed by Katha Cabal, RPH (Pharmacist) on 12/30/23 at 1056  Med List Status: <None>   Medication Order Taking? Sig Documenting Provider Last Dose Status Informant  acetaminophen-codeine (TYLENOL #3) 300-30 MG tablet [id number] No Take 1 tablet by mouth every 6 (six) hours as needed for moderate pain or severe pain.  Patient not taking: Reported on 12/30/2023   Sallyanne Kuster, NP Not Taking Active   atorvastatin (LIPITOR) 20 MG tablet [id number] Yes Take 1 tablet(s) by mouth at bedtime for cholesterol. [name], Salomon Fick, PA-C Taking Active   Blood Glucose Monitoring Suppl (ONE TOUCH ULTRA MINI) w/Device KIT [id number] Yes Use as directed diagnosis e11.65 Carlean Jews, NP Taking Active   dapagliflozin propanediol (FARXIGA) 10 MG TABS tablet [id number] Yes TAKE 1 TABLET BY MOUTH EVERY DAY BEFORE BREAKFAST Abernathy, Alyssa, NP Taking Active   gabapentin (NEURONTIN) 100 MG capsule [id number] Yes TAKE ONE CAPSULE BY MOUTH TWICE A DAY FOR DIABETIC NEUROPATHY Abernathy, Alyssa, NP Taking Active   glimepiride (AMARYL) 4 MG tablet [id number] Yes Take 1 tablet (4 mg total) by mouth daily before breakfast. Carlean Jews, PA-C Taking Active   glucose blood test strip [id number]  Yes 1 each by Other route daily. Check blood sugars once daily and as needed.  E11.65 Carlean Jews, PA-C Taking Active   ibuprofen (ADVIL) 800 MG tablet [id number] No Take 1 tablet (800 mg total) by mouth every 8 (eight) hours as needed for moderate pain.  Patient not taking: Reported on 12/30/2023   Carlean Jews, PA-C Not Taking Active   lisinopril-hydrochlorothiazide (ZESTORETIC) 20-12.5 MG tablet  [id number] Yes Take 1 tablet by mouth daily for blood pressure [name], Salomon Fick, PA-C Taking Active   Semaglutide,0.25 or 0.5MG /DOS, 2 MG/3ML SOPN [id number] No Inject 0.25 mg as directed once a week. May increase to .5mg  weekly after 4 weeks.  Patient not taking: Reported on 12/30/2023   Carlean Jews, PA-C Not Taking Active            Med Note Para March, [facility] R   Thu Dec 30, 2023 10:55 AM) He cannot afford medication  tadalafil (CIALIS) 20 MG tablet [id number] No 1 tab 1 hour prior to intercourse  Patient not taking: Reported on 12/30/2023   Carlean Jews, PA-C Not Taking Active            Med Note Para March, [facility] R   Thu Dec 30, 2023 10:55 AM) Doesn't work for patient, per pt  tizanidine (ZANAFLEX) 2 MG capsule [id number] No Take 2 mg by mouth 3 (three) times daily.  Patient not taking: Reported on 12/30/2023   [provider] Not Taking Active   traZODone (DESYREL) 50 MG tablet [id number] Yes TAKE 1/2 TO 1 TABLET (25 TO 50 MG TOTAL) BY MOUTH AT BEDTIME Sallyanne Kuster, NP Taking Active               Assessment/Plan:  Patient has uncontrolled diabetes. We were able to discuss financial needs of certain medications such as Ozempic. Mr. Verno denied patient assistance for Ozempic and [place]. Per discussion with patient, he does qualify for medication assistance given reported income.   Will follow up in 1-5 business days regarding diabetes management. Will send patient assistant pre-screening information to the pharmacy technician to address for follow-up.  Time Spent: 20 minutes  Thank you for allowing pharmacy to be a part of this patient's care. Cephus Shelling, PharmD Clinical Pharmacist Cell: [phone]

## 2024-01-05 ENCOUNTER — Telehealth: Payer: Self-pay

## 2024-01-05 DIAGNOSIS — Z79899 Other long term (current) drug therapy: Secondary | ICD-10-CM

## 2024-01-05 DIAGNOSIS — Z5986 Financial insecurity: Secondary | ICD-10-CM

## 2024-01-05 NOTE — Progress Notes (Signed)
   01/05/2024  Patient ID: Matthew Gay, male   DOB: [birth year], 73 y.o.   MRN: [id number]  Attempted to contact patient for medication management/review. Left HIPAA compliant message for patient to return my call at their convenience.   Second attempt for patient outreach. Will follow up with patient in 3-4 business days.  Thank you for allowing pharmacy to be a part of this patient's care.  Cephus Shelling, PharmD Clinical Pharmacist Cell: [phone]

## 2024-01-13 ENCOUNTER — Telehealth: Payer: Self-pay | Admitting: Pharmacy Technician

## 2024-01-13 DIAGNOSIS — Z5986 Financial insecurity: Secondary | ICD-10-CM

## 2024-01-13 NOTE — Progress Notes (Addendum)
 Pharmacy Medication Assistance Program Note    01/13/2024  Patient ID: JAHZIAH SIMONIN, male   DOB: [birth year], 73 y.o.   MRN: [id number]     01/13/2024  Outreach Medication One  Initial Outreach Date (Medication One) 01/03/2024  Manufacturer Medication One [employer]  [employer] Drugs Farxiga  Dose of Farxiga 10mg   Type of Radiographer, therapeutic Assistance  Date Application Sent to Patient 01/12/2024  Application Items Requested Application;Proof of Income;Other  Date Application Sent to Prescriber 01/19/2024  Name of Prescriber Sallyanne Kuster        01/13/2024  Outreach Medication Two  Initial Outreach Date (Medication Two) 01/03/2024  Manufacturer Medication Two Novo Nordisk  Nordisk Drugs Ozempic  Dose of Ozempic 2mg /60ml  Type of Radiographer, therapeutic Assistance  Date Application Sent to Patient 01/12/2024  Application Items Requested Application;Proof of Income;Other  Date Application Sent to Prescriber 01/19/2024  Name of Prescriber Sallyanne Kuster   [facility] 02/03/24 Unsuccessful outreach call placed to patient in regard to Ozempic application with [employer] and [place] application with [employer]. Unfortunately, patient did not answer the phone. The home phone number rings continuously with no answer and no way to leave a message. The mobile number says the voicemail box is not set up and to try the call again later.  ADDENDUM 02/07/2024 Unsuccessful outreach call placed to patient in regard to Ozempic application with [employer] and [place] application with [employer]. Unfortunately, patient did not answer the phone. The home phone number rings continuously with no answer and no way to leave a message. The mobile number says the voicemail box is not set up and to try the call again later. ADDENDUM 02/11/2024 Third unsuccessful outreach call placed to patient in regard to [employer] application with [employer] and [place] application with [employer]. Unfortunately, patient did not  answer the phone. The home phone number rings continuously with no answer and no way to leave a message. The mobile number says the voicemail box is not set up and to try the call again later.  Will route message to [facility] Pharmacist Cephus Shelling notifying her of case closure as been unable to reach patient. Will gladly reopen case if the information is mailed back.  Pattricia Boss, CPhT [place]  Office: [phone] Fax: [phone] Email: [email or web address]

## 2024-01-30 ENCOUNTER — Other Ambulatory Visit: Payer: Self-pay | Admitting: Nurse Practitioner

## 2024-01-30 DIAGNOSIS — F5101 Primary insomnia: Secondary | ICD-10-CM

## 2024-02-22 ENCOUNTER — Other Ambulatory Visit: Payer: Self-pay | Admitting: Physician Assistant

## 2024-02-22 DIAGNOSIS — F5101 Primary insomnia: Secondary | ICD-10-CM

## 2024-02-24 ENCOUNTER — Telehealth: Payer: Self-pay

## 2024-02-24 DIAGNOSIS — E1165 Type 2 diabetes mellitus with hyperglycemia: Secondary | ICD-10-CM

## 2024-02-24 NOTE — Progress Notes (Signed)
   02/24/2024  Patient ID: Matthew Gay, male   DOB: [birth year], 73 y.o.   MRN: [id number]  Attempted to contact patient for medication management and review but have been unable to establish care as the patient has not been responsive. Also attempted to enroll the patient in medication assistance for Farxiga and Ozempic, but the pharmacy technician was unable to reach him and closed out the referral.   Will continue to follow up with patient.   Thank you for allowing pharmacy to be a part of this patient's care.  Cephus Shelling, PharmD Clinical Pharmacist Cell: [phone]

## 2024-03-09 ENCOUNTER — Telehealth: Payer: Self-pay

## 2024-03-09 NOTE — Progress Notes (Signed)
   03/09/2024  Patient ID: Matthew Gay, male   DOB: June 06, 1951, 73 y.o.   MRN: [id number]  Contacted patient regarding referral for diabetes from  quality report for diabetes [employer] .    I unable to reach patient, no voicemail available. I called both numbers on file and neither of them allowed for a voicemail to be left for the patient to return my call.   Alexandria Angel, PharmD Clinical Pharmacist Cell: [phone]

## 2024-04-05 ENCOUNTER — Telehealth: Payer: Self-pay

## 2024-04-05 DIAGNOSIS — E1165 Type 2 diabetes mellitus with hyperglycemia: Secondary | ICD-10-CM

## 2024-04-05 NOTE — Progress Notes (Signed)
   04/05/2024  Patient ID: Matthew Gay, male   DOB: 1951/03/05, 73 y.o.   MRN: [id number]  Patient scheduled for medication review/management for diabetes, related to [employer], for an appointment on 04/07/2024 between 9-11 am. He states that he just got off work and have not slept; he also states that he wanted the call to be brief as he doesn't like to be on the phone that long.   Will call patient on Friday, but see if he is open to an in-person encounter.   Prior to outreach, reviewed medication fill history. Will verify fills for Farxiga  and Semaglutide  as [facility] team was unable to reach patient; will clarify if patient is receiving PAP from clinic.    Thank you for allowing pharmacy to be a part of this patient's care.  Alexandria Angel, PharmD Clinical Pharmacist [facility] Cell: [phone]

## 2024-04-07 ENCOUNTER — Telehealth: Payer: Self-pay

## 2024-04-07 DIAGNOSIS — Z79899 Other long term (current) drug therapy: Secondary | ICD-10-CM

## 2024-04-07 NOTE — Progress Notes (Signed)
   04/07/2024  Patient ID: Matthew Gay, male   DOB: 1951/03/20, 73 y.o.   MRN: [id number]  I called patient and left a message on the mobile number on file. Matthew Gay returned my call and stated to call back next week, preferably Monday or Tuesday around the same time.   Appointment scheduled for Tuesday.   Alexandria Angel, PharmD Clinical Pharmacist Cell: [phone]

## 2024-04-11 ENCOUNTER — Other Ambulatory Visit

## 2024-04-11 ENCOUNTER — Telehealth: Payer: Self-pay

## 2024-04-11 DIAGNOSIS — E1165 Type 2 diabetes mellitus with hyperglycemia: Secondary | ICD-10-CM

## 2024-04-11 NOTE — Progress Notes (Signed)
   04/11/2024  Patient ID: Matthew Gay, male   DOB: 17-May-1951, 73 y.o.   MRN: [id number]  I called patient but was unable to reach. Will follow up for another patient outreach attempt.   Alexandria Angel, PharmD Clinical Pharmacist Cell: [phone]

## 2024-04-22 ENCOUNTER — Other Ambulatory Visit: Payer: Self-pay | Admitting: Physician Assistant

## 2024-04-22 DIAGNOSIS — I10 Essential (primary) hypertension: Secondary | ICD-10-CM

## 2024-04-27 ENCOUNTER — Other Ambulatory Visit: Payer: Self-pay | Admitting: Physician Assistant

## 2024-04-27 DIAGNOSIS — E1165 Type 2 diabetes mellitus with hyperglycemia: Secondary | ICD-10-CM

## 2024-05-08 ENCOUNTER — Telehealth: Payer: Self-pay

## 2024-05-08 NOTE — Progress Notes (Signed)
   05/08/2024  Patient ID: Matthew Gay, male   DOB: 1951/09/07, 73 y.o.   MRN: [id number]  Attempted to contact patient for medication management/review. I spoke with patient but he reported it was not a good time to talk and asked that I call back around the same time tomorrow.    Will follow up with patient tomorrow. Will reach out to office staff to schedule an appointment for tomorrow.   Thank you for allowing pharmacy to be a part of this patient's care.  Dorcas Solian, PharmD Clinical Pharmacist Cell: [phone]

## 2024-05-12 ENCOUNTER — Other Ambulatory Visit: Payer: Self-pay

## 2024-05-12 ENCOUNTER — Telehealth: Payer: Self-pay

## 2024-05-12 NOTE — Progress Notes (Signed)
   05/12/2024  Patient ID: Matthew Gay, male   DOB: 18-Dec-1950, 73 y.o.   MRN: [id number]   Attempted to contact patient for Diabetes [employer]. Left HIPAA compliant message for patient to return my call at their convenience. He was contacted and scheduled for an appointment today at 1300.   Will follow up with patient to reschedule an appointment. Per Dr. Annemarie, patient is overdue for refills on several medications. It is unclear if patient is filling medication somewhere else. Will send a patient message. Upcoming appointment on 05/29/2024 with PCP.   Thank you for allowing pharmacy to be a part of this patient's care.  Dorcas Solian, PharmD Clinical Pharmacist Cell: [phone]

## 2024-05-12 NOTE — Progress Notes (Signed)
   05/12/2024  Patient ID: Matthew Gay, male   DOB: November 17, 1950, 73 y.o.   MRN: [id number]  I called the patient for a second attempt today for an outreach, but was unable to reach him. I was not able to leave a voicemail.   Dorcas Solian, PharmD Clinical Pharmacist Cell: [phone]

## 2024-05-22 ENCOUNTER — Observation Stay
Admission: EM | Admit: 2024-05-22 | Discharge: 2024-05-23 | Disposition: A | Discharge status: Home / Self Care | Payer: Medicare (Managed Care) | Attending: Internal Medicine | Admitting: Internal Medicine

## 2024-05-22 ENCOUNTER — Inpatient Hospital Stay: Payer: Medicare (Managed Care)

## 2024-05-22 ENCOUNTER — Other Ambulatory Visit: Payer: Self-pay

## 2024-05-22 ENCOUNTER — Emergency Department: Payer: Medicare (Managed Care)

## 2024-05-22 ENCOUNTER — Encounter: Payer: Self-pay | Admitting: Emergency Medicine

## 2024-05-22 DIAGNOSIS — F5101 Primary insomnia: Secondary | ICD-10-CM | POA: Insufficient documentation

## 2024-05-22 DIAGNOSIS — Z8249 Family history of ischemic heart disease and other diseases of the circulatory system: Secondary | ICD-10-CM | POA: Diagnosis not present

## 2024-05-22 DIAGNOSIS — N529 Male erectile dysfunction, unspecified: Secondary | ICD-10-CM | POA: Diagnosis present

## 2024-05-22 DIAGNOSIS — Z794 Long term (current) use of insulin: Secondary | ICD-10-CM | POA: Diagnosis not present

## 2024-05-22 DIAGNOSIS — I10 Essential (primary) hypertension: Secondary | ICD-10-CM | POA: Diagnosis not present

## 2024-05-22 DIAGNOSIS — F1090 Alcohol use, unspecified, uncomplicated: Secondary | ICD-10-CM | POA: Insufficient documentation

## 2024-05-22 DIAGNOSIS — Z7984 Long term (current) use of oral hypoglycemic drugs: Secondary | ICD-10-CM

## 2024-05-22 DIAGNOSIS — E1140 Type 2 diabetes mellitus with diabetic neuropathy, unspecified: Secondary | ICD-10-CM | POA: Insufficient documentation

## 2024-05-22 DIAGNOSIS — I129 Hypertensive chronic kidney disease with stage 1 through stage 4 chronic kidney disease, or unspecified chronic kidney disease: Secondary | ICD-10-CM | POA: Diagnosis not present

## 2024-05-22 DIAGNOSIS — E1165 Type 2 diabetes mellitus with hyperglycemia: Principal | ICD-10-CM | POA: Insufficient documentation

## 2024-05-22 DIAGNOSIS — E1110 Type 2 diabetes mellitus with ketoacidosis without coma: Secondary | ICD-10-CM | POA: Diagnosis not present

## 2024-05-22 DIAGNOSIS — Z79899 Other long term (current) drug therapy: Secondary | ICD-10-CM | POA: Insufficient documentation

## 2024-05-22 DIAGNOSIS — F17290 Nicotine dependence, other tobacco product, uncomplicated: Secondary | ICD-10-CM | POA: Diagnosis present

## 2024-05-22 DIAGNOSIS — R739 Hyperglycemia, unspecified: Principal | ICD-10-CM

## 2024-05-22 DIAGNOSIS — G629 Polyneuropathy, unspecified: Secondary | ICD-10-CM

## 2024-05-22 DIAGNOSIS — N179 Acute kidney failure, unspecified: Secondary | ICD-10-CM | POA: Insufficient documentation

## 2024-05-22 DIAGNOSIS — T383X6A Underdosing of insulin and oral hypoglycemic [antidiabetic] drugs, initial encounter: Secondary | ICD-10-CM | POA: Diagnosis not present

## 2024-05-22 DIAGNOSIS — Z91128 Patient's intentional underdosing of medication regimen for other reason: Secondary | ICD-10-CM

## 2024-05-22 DIAGNOSIS — N182 Chronic kidney disease, stage 2 (mild): Secondary | ICD-10-CM | POA: Insufficient documentation

## 2024-05-22 DIAGNOSIS — R300 Dysuria: Secondary | ICD-10-CM | POA: Insufficient documentation

## 2024-05-22 DIAGNOSIS — R519 Headache, unspecified: Secondary | ICD-10-CM | POA: Diagnosis present

## 2024-05-22 DIAGNOSIS — E1122 Type 2 diabetes mellitus with diabetic chronic kidney disease: Secondary | ICD-10-CM | POA: Diagnosis not present

## 2024-05-22 DIAGNOSIS — F17210 Nicotine dependence, cigarettes, uncomplicated: Secondary | ICD-10-CM | POA: Insufficient documentation

## 2024-05-22 DIAGNOSIS — E119 Type 2 diabetes mellitus without complications: Secondary | ICD-10-CM

## 2024-05-22 DIAGNOSIS — N189 Chronic kidney disease, unspecified: Secondary | ICD-10-CM | POA: Diagnosis present

## 2024-05-22 DIAGNOSIS — E785 Hyperlipidemia, unspecified: Secondary | ICD-10-CM | POA: Insufficient documentation

## 2024-05-22 LAB — URINALYSIS, ROUTINE W REFLEX MICROSCOPIC
Bacteria, UA: NONE SEEN
Bilirubin Urine: NEGATIVE
Glucose, UA: >=500 mg/dL — AB
Hgb urine dipstick: NEGATIVE
Ketones, ur: NEGATIVE mg/dL
Leukocytes,Ua: NEGATIVE
Nitrite: NEGATIVE
Protein, ur: NEGATIVE mg/dL
RBC / HPF: 0 RBC/hpf (ref 0–5)
Specific Gravity, Urine: 1.022 (ref 1.005–1.030)
Squamous Epithelial / HPF: 0 /HPF (ref 0–5)
pH: 5 (ref 5.0–8.0)

## 2024-05-22 LAB — BASIC METABOLIC PANEL WITH GFR
Anion gap: 15 (ref 5–15)
Anion gap: 14 (ref 5–15)
Anion gap: 11 (ref 5–15)
BUN: 54 mg/dL — ABNORMAL HIGH (ref 8–23)
BUN: 50 mg/dL — ABNORMAL HIGH (ref 8–23)
BUN: 45 mg/dL — ABNORMAL HIGH (ref 8–23)
CO2: 23 mmol/L (ref 22–32)
CO2: 25 mmol/L (ref 22–32)
CO2: 23 mmol/L (ref 22–32)
Calcium: 9.3 mg/dL (ref 8.9–10.3)
Calcium: 9.5 mg/dL (ref 8.9–10.3)
Calcium: 8.6 mg/dL — ABNORMAL LOW (ref 8.9–10.3)
Chloride: 84 mmol/L — ABNORMAL LOW (ref 98–111)
Chloride: 95 mmol/L — ABNORMAL LOW (ref 98–111)
Chloride: 97 mmol/L — ABNORMAL LOW (ref 98–111)
Creatinine, Ser: 2.5 mg/dL — ABNORMAL HIGH (ref 0.61–1.24)
Creatinine, Ser: 1.99 mg/dL — ABNORMAL HIGH (ref 0.61–1.24)
Creatinine, Ser: 1.42 mg/dL — ABNORMAL HIGH (ref 0.61–1.24)
GFR, Estimated: 27 mL/min — ABNORMAL LOW (ref >60)
GFR, Estimated: 35 mL/min — ABNORMAL LOW (ref >60)
GFR, Estimated: 53 mL/min — ABNORMAL LOW (ref >60)
Glucose, Bld: 787 mg/dL (ref 70–99)
Glucose, Bld: 207 mg/dL — ABNORMAL HIGH (ref 70–99)
Glucose, Bld: 198 mg/dL — ABNORMAL HIGH (ref 70–99)
Potassium: 4.7 mmol/L (ref 3.5–5.1)
Potassium: 3.7 mmol/L (ref 3.5–5.1)
Potassium: 4 mmol/L (ref 3.5–5.1)
Sodium: 122 mmol/L — ABNORMAL LOW (ref 135–145)
Sodium: 134 mmol/L — ABNORMAL LOW (ref 135–145)
Sodium: 131 mmol/L — ABNORMAL LOW (ref 135–145)

## 2024-05-22 LAB — CBC WITH DIFFERENTIAL/PLATELET
Abs Immature Granulocytes: 0.05 K/uL (ref 0.00–0.07)
Basophils Absolute: 0 K/uL (ref 0.0–0.1)
Basophils Relative: 0 %
Eosinophils Absolute: 0 K/uL (ref 0.0–0.5)
Eosinophils Relative: 0 %
HCT: 44.1 % (ref 39.0–52.0)
Hemoglobin: 15.1 g/dL (ref 13.0–17.0)
Immature Granulocytes: 0 %
Lymphocytes Relative: 8 %
Lymphs Abs: 1.1 K/uL (ref 0.7–4.0)
MCH: 28.6 pg (ref 26.0–34.0)
MCHC: 34.2 g/dL (ref 30.0–36.0)
MCV: 83.5 fL (ref 80.0–100.0)
Monocytes Absolute: 0.6 K/uL (ref 0.1–1.0)
Monocytes Relative: 4 %
Neutro Abs: 12.1 K/uL — ABNORMAL HIGH (ref 1.7–7.7)
Neutrophils Relative %: 88 %
Platelets: 251 K/uL (ref 150–400)
RBC: 5.28 MIL/uL (ref 4.22–5.81)
RDW: 13.2 % (ref 11.5–15.5)
WBC: 13.9 K/uL — ABNORMAL HIGH (ref 4.0–10.5)
nRBC: 0 % (ref 0.0–0.2)

## 2024-05-22 LAB — HEMOGLOBIN A1C
Hgb A1c MFr Bld: 13.6 % — ABNORMAL HIGH (ref 4.8–5.6)
Mean Plasma Glucose: 343.62 mg/dL

## 2024-05-22 LAB — GLUCOSE, CAPILLARY: Glucose-Capillary: 208 mg/dL — ABNORMAL HIGH (ref 70–99)

## 2024-05-22 LAB — MAGNESIUM: Magnesium: 2.6 mg/dL — ABNORMAL HIGH (ref 1.7–2.4)

## 2024-05-22 LAB — TROPONIN I (HIGH SENSITIVITY): Troponin I (High Sensitivity): 5 ng/L (ref <18)

## 2024-05-22 LAB — BETA-HYDROXYBUTYRIC ACID: Beta-Hydroxybutyric Acid: 0.13 mmol/L (ref 0.05–0.27)

## 2024-05-22 LAB — CBG MONITORING, ED: Glucose-Capillary: 567 mg/dL (ref 70–99)

## 2024-05-22 LAB — PHOSPHORUS: Phosphorus: 3.8 mg/dL (ref 2.5–4.6)

## 2024-05-22 MED ORDER — ONDANSETRON HCL 4 MG/2ML IJ SOLN: 4.0000 mg | Freq: Four times a day (QID) | INTRAMUSCULAR | Status: DC | PRN

## 2024-05-22 MED ORDER — HYDRALAZINE HCL 20 MG/ML IJ SOLN: 5.0000 mg | Freq: Four times a day (QID) | INTRAMUSCULAR | Status: DC | PRN

## 2024-05-22 MED ORDER — MELATONIN 5 MG PO TABS: 5.0000 mg | ORAL_TABLET | Freq: Every evening | ORAL | Status: DC | PRN

## 2024-05-22 MED ORDER — SODIUM CHLORIDE 0.9 % IV BOLUS
1000.0000 mL | Freq: Once | INTRAVENOUS | Status: AC
  Administered 2024-05-22: 1000 mL via INTRAVENOUS

## 2024-05-22 MED ORDER — ATORVASTATIN CALCIUM 20 MG PO TABS
20.0000 mg | ORAL_TABLET | Freq: Every day | ORAL | Status: DC
  Administered 2024-05-22: 20 mg via ORAL
  Filled 2024-05-22: qty 1

## 2024-05-22 MED ORDER — DEXTROSE IN LACTATED RINGERS 5 % IV SOLN: INTRAVENOUS | Status: DC

## 2024-05-22 MED ORDER — INSULIN REGULAR(HUMAN) IN NACL 100-0.9 UT/100ML-% IV SOLN: INTRAVENOUS | Status: DC

## 2024-05-22 MED ORDER — INSULIN ASPART 100 UNIT/ML IJ SOLN
0.0000 [IU] | Freq: Three times a day (TID) | INTRAMUSCULAR | Status: DC
  Administered 2024-05-23 (×2): 2 [IU] via SUBCUTANEOUS
  Filled 2024-05-22 (×2): qty 1

## 2024-05-22 MED ORDER — INSULIN ASPART 100 UNIT/ML IJ SOLN
0.0000 [IU] | Freq: Every day | INTRAMUSCULAR | Status: DC
  Administered 2024-05-22: 2 [IU] via SUBCUTANEOUS
  Filled 2024-05-22: qty 1

## 2024-05-22 MED ORDER — HEPARIN SODIUM (PORCINE) 5000 UNIT/ML IJ SOLN
5000.0000 [IU] | Freq: Three times a day (TID) | INTRAMUSCULAR | Status: DC
  Administered 2024-05-22 – 2024-05-23 (×2): 5000 [IU] via SUBCUTANEOUS
  Filled 2024-05-22 (×2): qty 1

## 2024-05-22 MED ORDER — ACETAMINOPHEN 325 MG PO TABS: 650.0000 mg | ORAL_TABLET | Freq: Four times a day (QID) | ORAL | Status: DC | PRN

## 2024-05-22 MED ORDER — DEXTROSE 50 % IV SOLN: 0.0000 mL | INTRAVENOUS | Status: DC | PRN

## 2024-05-22 MED ORDER — INSULIN ASPART 100 UNIT/ML IJ SOLN
15.0000 [IU] | Freq: Once | INTRAMUSCULAR | Status: DC
  Filled 2024-05-22: qty 1

## 2024-05-22 MED ORDER — LACTATED RINGERS IV SOLN: INTRAVENOUS | Status: DC

## 2024-05-22 MED ORDER — POTASSIUM CHLORIDE CRYS ER 20 MEQ PO TBCR
20.0000 meq | EXTENDED_RELEASE_TABLET | Freq: Once | ORAL | Status: AC
  Administered 2024-05-22: 20 meq via ORAL
  Filled 2024-05-22: qty 1

## 2024-05-22 MED ORDER — INSULIN ASPART 100 UNIT/ML IJ SOLN
15.0000 [IU] | Freq: Once | INTRAMUSCULAR | Status: AC
  Administered 2024-05-22: 15 [IU] via INTRAVENOUS

## 2024-05-22 NOTE — Assessment & Plan Note (Signed)
 Urine analysis negative for infection.  Did have glucose in the urine.

## 2024-05-22 NOTE — Assessment & Plan Note (Deleted)
 Mild DKA suspected initially UA has been ordered and collected and pending results at this time Beta-hydroxybutyrate was negative UA is pending Endo tool was not needed as GAP closed with insulin  aspart 15 units IV

## 2024-05-22 NOTE — Assessment & Plan Note (Signed)
 Patient reports has not been taking home insulin  due to poor appetite

## 2024-05-22 NOTE — Hospital Course (Signed)
 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  Vitals in the ED showed temperature of 98, respiration rate 17, heart rate 99, blood pressure 112/72, SpO2 100% on room air.  Serum sodium is 122, potassium 4.7, chloride 84, bicarb 23, BUN of 54, serum creatinine of 2.50, EGFR of 27, nonfasting blood glucose 787, WBC 13.9, hemoglobin 15.1, platelets of 251.  Anion gap was elevated at 15.  ED treatment: Sodium chloride  1 L bolus.  7/8.  Patient was given Semglee  insulin  this morning 12 units.  Case discussed with diabetes coordinator since cost is an issue will do 75/25 Humalog  mix pen 8 units twice a day starting tomorrow.  Patient was taught how to inject insulin .  His wife also injects insulin  so we felt comfortable discharging home today.

## 2024-05-22 NOTE — Assessment & Plan Note (Signed)
 Hydralazine  5 mg IV every 6 hours prn SBP greater 170, 5 days ordered

## 2024-05-22 NOTE — ED Notes (Signed)
 Per Dr Sherre, will get BMP before determining whether to start IV insulin  infusion.

## 2024-05-22 NOTE — ED Notes (Signed)
 Called lab--UA not showing as collected or resulted but was sent around 1230 pm.

## 2024-05-22 NOTE — ED Notes (Signed)
 Pt given a tv remote. Pt has no other needs at this time.

## 2024-05-22 NOTE — Assessment & Plan Note (Signed)
 Status post sodium chloride  1 L bolus per EDP Recheck BMP every 4 hours Baseline serum creatinine is 1.32-1.36/eGFR 55

## 2024-05-22 NOTE — Assessment & Plan Note (Signed)
 As needed melatonin 5 mg nightly as needed for sleep

## 2024-05-22 NOTE — ED Provider Notes (Signed)
 [facility] Provider Note    Event Date/Time   First MD Initiated Contact with Patient 05/22/24 1124     (approximate)   History   Headache   HPI  Matthew Gay is a 73 y.o. male who presents to the emergency department today because of concerns for headache.  Headache has been present since yesterday.  Noticed it worse today at work.  He also states that recently has had decreased appetite.  He denies any fevers.  Denies any trauma to his head.  Does have history of diabetes.  Has not been checking his sugars recently.     Physical Exam   Triage Vital Signs: ED Triage Vitals  Encounter Vitals Group     BP 05/22/24 1025 112/72     Girls Systolic BP Percentile --      Girls Diastolic BP Percentile --      Boys Systolic BP Percentile --      Boys Diastolic BP Percentile --      Pulse Rate 05/22/24 1025 99     Resp 05/22/24 1025 17     Temp 05/22/24 1025 97.9 F (36.6 C)     Temp Source 05/22/24 1025 Oral     SpO2 05/22/24 1025 100 %     Weight 05/22/24 1026 156 lb (70.8 kg)     Height 05/22/24 1026 5' 9 (1.753 m)     Head Circumference --      Peak Flow --      Pain Score 05/22/24 1026 8     Pain Loc --      Pain Education --      Exclude from Growth Chart --     Most recent vital signs: Vitals:   05/22/24 1025 05/22/24 1330  BP: 112/72 115/70  Pulse: 99 98  Resp: 17 20  Temp: 97.9 F (36.6 C)   SpO2: 100% 100%   General: Awake, alert, oriented. CV:  Good peripheral perfusion. Regular rate and rhythm. Resp:  Normal effort. Lungs clear. Abd:  No distention. Non tender.   ED Results / Procedures / Treatments   Labs (all labs ordered are listed, but only abnormal results are displayed) Labs Reviewed  CBC WITH DIFFERENTIAL/PLATELET - Abnormal; Notable for the following components:      Result Value   WBC 13.9 (*)    Neutro Abs 12.1 (*)    All other components within normal limits  BASIC METABOLIC PANEL WITH GFR - Abnormal;  Notable for the following components:   Sodium 122 (*)    Chloride 84 (*)    Glucose, Bld 787 (*)    BUN 54 (*)    Creatinine, Ser 2.50 (*)    GFR, Estimated 27 (*)    All other components within normal limits  URINALYSIS, ROUTINE W REFLEX MICROSCOPIC     EKG  None   RADIOLOGY I independently interpreted and visualized the CT head. My interpretation: No ICH Radiology interpretation:  IMPRESSION:  No CT evidence of acute intracranial abnormality.    Chronic microvascular ischemic changes.     PROCEDURES:  Critical Care performed: No    MEDICATIONS ORDERED IN ED: Medications  sodium chloride  0.9 % bolus 1,000 mL (1,000 mLs Intravenous New Bag/Given 05/22/24 1228)     IMPRESSION / MDM / ASSESSMENT AND PLAN / ED COURSE  I reviewed the triage vital signs and the nursing notes.  Differential diagnosis includes, but is not limited to, ICH, general headache, hyperglycemia, infection  Patient's presentation is most consistent with acute presentation with potential threat to life or bodily function.   Patient presented to the emergency department today because of concerns for headache that started yesterday.  On exam patient is awake alert and oriented.  Blood work here was notable for significantly elevated blood glucose.  No anion gap to suggest DKA.  Creatinine is also elevated concerning for AKI.  Will start IV fluids. CT head was obtained which did not show any concerning acute findings.  Do anticipate patient requiring admission to the hospital service.     FINAL CLINICAL IMPRESSION(S) / ED DIAGNOSES   Final diagnoses:  Hyperglycemia      Note:  This document was prepared using Dragon voice recognition software and may include unintentional dictation errors.    Floy Roberts, MD 05/22/24 [phone]

## 2024-05-22 NOTE — ED Notes (Signed)
 Wrote to Dr Sherre re: order for insulin  infusion or instead trying IVP plus subcut dosing.

## 2024-05-22 NOTE — H&P (Addendum)
 History and Physical   Matthew Gay FMW:[id number] DOB: 12/20/1950 DOA: 05/22/2024  PCP: Matthew Matthew POUR, Gay  Patient coming from: home  I have personally briefly reviewed patient's old medical records in [facility] EMR.  Chief Concern: Headache  HPI: Matthew Gay is a 73 year old male with history of insulin -dependent diabetes mellitus, poorly controlled, hypertension, hyperlipidemia, insomnia, who presents emergency department for chief concerns of headache.  Vitals in the ED showed temperature of 98, respiration rate 17, heart rate 99, blood pressure 112/72, SpO2 100% on room air.  Serum sodium is 122, potassium 4.7, chloride 84, bicarb 23, BUN of 54, serum creatinine of 2.50, EGFR of 27, nonfasting blood glucose 787, WBC 13.9, hemoglobin 15.1, platelets of 251.  Anion gap was elevated at 15.  ED treatment: Sodium chloride  1 L bolus. ------------------------------------ At bedside, patient able to tell me his first and last name, his age and location of hospital.  He denies trauma to his person.  Reports he has not been taking his insulin  for an unknown amount of time.  He does not know why he does not take his insulin .  He denies trauma to his person, chest pain, shortness of breath, diarrhea.  He endorses dysuria that has started for a while now.  Social history: He lives at home with his wife.  He denies current tobacco, EtOH, recreational drug use.  He is retired and formally worked in a [employer].  ROS: Constitutional: no weight change, no fever ENT/Mouth: no sore throat, no rhinorrhea Eyes: no eye pain, no vision changes Cardiovascular: no chest pain, no dyspnea,  no edema, no palpitations Respiratory: no cough, no sputum, no wheezing Gastrointestinal: no nausea, no vomiting, no diarrhea, no constipation Genitourinary: no urinary incontinence, + dysuria, no hematuria Musculoskeletal: no arthralgias, no myalgias Skin: no skin lesions, no pruritus, Neuro:  + weakness, no loss of consciousness, no syncope Psych: no anxiety, no depression, + decrease appetite Heme/Lymph: no bruising, no bleeding  ED Course: Discussed with EDP, patient requiring hospitalization for chief concerns of AKI.  Assessment/Plan  Principal Problem:   AKI (acute kidney injury) (HCC) Active Problems:   Diabetes mellitus without complication (HCC)   Uncontrolled type 2 diabetes mellitus with hyperglycemia (HCC)   Primary insomnia   Hypertension   Erectile dysfunction   DKA (diabetic ketoacidosis) (HCC)   Dysuria   Hyperlipidemia   Assessment and Plan:  * AKI (acute kidney injury) (HCC) Status post sodium chloride  1 L bolus per EDP Recheck BMP every 4 hours Baseline serum creatinine is 1.32-1.36/eGFR 55  Hyperlipidemia Home atorvastatin  20 mg nightly resume  Dysuria UA has been ordered and collected and pending results at this time  DKA (diabetic ketoacidosis) (HCC) Mild DKA suspected initially Beta-hydroxybutyrate was negative UA is pending Endo tool was not needed as GAP closed with insulin  aspart 15 units IV Insulin  via Endo tool discontinued Patient bed changed to telemetry medical, inpatient  Hypertension Hydralazine  5 mg IV every 6 hours prn SBP greater 170, 5 days ordered  Primary insomnia As needed melatonin 5 mg nightly as needed for sleep  Diabetes mellitus without complication (HCC) Patient reports has not been taking home insulin  due to poor appetite  Chart reviewed.   DVT prophylaxis: Heparin  5000 units subcutaneous every 8 hours Code Status: Full code Diet: N.p.o. pending closure of anion gap Family Communication: attempted to call spouse, Matthew Gay at [phone], no pick up Disposition Plan: pending clinical course  Consults called: none at this time Admission status:  Inpatient, stepdown  Past Medical History:  Diagnosis Date   Diabetes mellitus without complication (HCC)    Hyperlipemia    Hypertension    Past  Surgical History:  Procedure Laterality Date   colon cancer     COLON SURGERY     Social History:  reports that he has been smoking cigars. He has never used smokeless tobacco. He reports current alcohol use. He reports that he does not use drugs.  No Known Allergies Family History  Problem Relation Age of Onset   Hypertension Father    Family history: Family history reviewed and not pertinent.  Prior to Admission medications   Medication Sig Start Date End Date Taking? Authorizing Provider  acetaminophen -codeine  (TYLENOL  #3) 300-30 MG tablet Take 1 tablet by mouth every 6 (six) hours as needed for moderate pain or severe pain. Patient not taking: Reported on 12/30/2023 12/25/22   Matthew Matthew Gay  atorvastatin  (LIPITOR) 20 MG tablet Take 1 tablet(s) by mouth at bedtime for cholesterol. 06/14/23   Matthew Gay, Matthew POUR, Gay  Blood Glucose Monitoring Suppl (ONE TOUCH ULTRA MINI) w/Device KIT Use as directed diagnosis e11.65 03/23/18   Matthew Gay  dapagliflozin  propanediol (FARXIGA ) 10 MG TABS tablet TAKE 1 TABLET BY MOUTH EVERY DAY BEFORE BREAKFAST 12/25/22   Matthew Gay, Alyssa, Gay  gabapentin  (NEURONTIN ) 100 MG capsule TAKE ONE CAPSULE BY MOUTH TWICE A DAY FOR DIABETIC NEUROPATHY 12/25/22   Matthew Gay, Matthew Gay  glimepiride  (AMARYL ) 4 MG tablet TAKE 1 TABLET BY MOUTH DAILY BEFORE BREAKFAST. 04/27/24   Matthew Gay, Matthew Gay  glucose blood test strip 1 each by Other route daily. Check blood sugars once daily and as needed.  E11.65 04/16/22   Matthew Gay, Matthew Gay  ibuprofen  (ADVIL ) 800 MG tablet Take 1 tablet (800 mg total) by mouth every 8 (eight) hours as needed for moderate pain. Patient not taking: Reported on 12/30/2023 08/15/21   Matthew Gay, Matthew Gay  lisinopril -hydrochlorothiazide  (ZESTORETIC ) 20-12.5 MG tablet TAKE 1 TABLET BY MOUTH EVERY DAY FOR BLOOD PRESSURE 04/24/24   Matthew Gay, Matthew Gay  Semaglutide ,0.25 or 0.5MG /DOS, 2 MG/3ML SOPN Inject 0.25 mg as directed  once a week. May increase to .5mg  weekly after 4 weeks. Patient not taking: Reported on 12/30/2023 06/02/23   Matthew Matthew POUR, Gay  tadalafil  (CIALIS ) 20 MG tablet 1 tab 1 hour prior to intercourse Patient not taking: Reported on 12/30/2023 07/29/23   Matthew Gay, Matthew Gay  tizanidine (ZANAFLEX) 2 MG capsule Take 2 mg by mouth 3 (three) times daily. Patient not taking: Reported on 12/30/2023    [provider]  traZODone  (DESYREL ) 50 MG tablet TAKE 1/2 TO 1 TABLET (25 TO 50 MG TOTAL) BY MOUTH AT BEDTIME 01/31/24   Matthew Gay, Matthew POUR, Gay   Physical Exam: Vitals:   05/22/24 1330 05/22/24 1430 05/22/24 1519 05/22/24 1600  BP: 115/70 109/73  118/79  Pulse: 98 79  84  Resp: 20 18  20   Temp:   98 F (36.7 C)   TempSrc:   Oral   SpO2: 100% 98%  100%  Weight:      Height:       Constitutional: appears age-appropriate, NAD, calm Eyes: PERRL, lids and conjunctivae normal ENMT: Mucous membranes are moist. Posterior pharynx clear of any exudate or lesions. Age-appropriate dentition. Hearing appropriate Neck: normal, supple, no masses, no thyromegaly Respiratory: clear to auscultation bilaterally, no wheezing, no crackles. Normal respiratory effort. No accessory muscle use.  Cardiovascular: Regular rate and rhythm, no murmurs /  rubs / gallops. No extremity edema. 2+ pedal pulses. No carotid bruits.  Abdomen: no tenderness, no masses palpated, no hepatosplenomegaly. Bowel sounds positive.  Musculoskeletal: no clubbing / cyanosis. No joint deformity upper and lower extremities. Good ROM, no contractures, no atrophy. Normal muscle tone.  Skin: no rashes, lesions, ulcers. No induration Neurologic: Sensation intact. Strength 5/5 in all 4.  Psychiatric: Normal judgment and insight. Alert and oriented x 3. Normal mood.   EKG: independently reviewed, showing sinus rhythm rate of 86, QTc 426  Chest x-ray on Admission: I personally reviewed and negative for chest x-ray evidence of acute  cardiopulmonary process.  Portable chest x-ray (1 view) Result Date: 05/22/2024 CLINICAL DATA:  [id number] DKA (diabetic ketoacidosis) (HCC) [id number] EXAM: PORTABLE CHEST 1 VIEW COMPARISON:  Chest x-ray 04/05/2008 FINDINGS: Patient is rotated The heart and mediastinal contours are within normal limits. No focal consolidation. No pulmonary edema. No pleural effusion. No pneumothorax. No acute osseous abnormality. IMPRESSION: No active disease. Electronically Signed   By: Morgane  Naveau M.D.   On: 05/22/2024 16:59   CT Head Wo Contrast Result Date: 05/22/2024 CLINICAL DATA:  Headache starting last night while working. Lack of appetite. EXAM: CT HEAD WITHOUT CONTRAST TECHNIQUE: Contiguous axial images were obtained from the base of the skull through the vertex without intravenous contrast. RADIATION DOSE REDUCTION: This exam was performed according to the departmental dose-optimization program which includes automated exposure control, adjustment of the mA and/or kV according to patient size and/or use of iterative reconstruction technique. COMPARISON:  None Available. FINDINGS: Brain: No acute intracranial hemorrhage. No CT evidence of acute infarct. Nonspecific hypoattenuation in the periventricular and subcortical white matter favored to reflect chronic microvascular ischemic changes. Mild parenchymal volume loss. No edema, mass effect, or midline shift. The basilar cisterns are patent. Ventricles: The ventricles are normal. Vascular: No hyperdense vessel or unexpected calcification. Skull: No acute or aggressive finding. Orbits: Orbits are symmetric. Sinuses: The visualized paranasal sinuses are clear. Other: Mastoid air cells are clear. IMPRESSION: No CT evidence of acute intracranial abnormality. Chronic microvascular ischemic changes. Electronically Signed   By: Donnice Mania M.D.   On: 05/22/2024 12:59   Labs on Admission: I have personally reviewed following labs  CBC: Recent Labs  Lab 05/22/24 1027   WBC 13.9*  NEUTROABS 12.1*  HGB 15.1  HCT 44.1  MCV 83.5  PLT 251   Basic Metabolic Panel: Recent Labs  Lab 05/22/24 1027 05/22/24 1635  NA 122* 134*  Gay 4.7 3.7  CL 84* 95*  CO2 23 25  GLUCOSE 787* 207*  BUN 54* 50*  CREATININE 2.50* 1.99*  CALCIUM  9.3 9.5  MG  --  2.6*  PHOS  --  3.8   GFR: Estimated Creatinine Clearance: 33.6 mL/min (A) (by C-G formula based on SCr of 1.99 mg/dL (H)).  CBG: Recent Labs  Lab 05/22/24 1503  GLUCAP 567*   Urine analysis:    Component Value Date/Time   APPEARANCEUR Clear 05/27/2023 1550   GLUCOSEU 3+ (A) 05/27/2023 1550   BILIRUBINUR Negative 05/27/2023 1550   PROTEINUR Negative 05/27/2023 1550   NITRITE Negative 05/27/2023 1550   LEUKOCYTESUR Negative 05/27/2023 1550   This document was prepared using Dragon Voice Recognition software and may include unintentional dictation errors.  Dr. Sherre [facility]  If 7PM-7AM, please contact overnight-coverage provider If 7AM-7PM, please contact day attending provider [email or web address]  05/22/2024, 5:50 PM

## 2024-05-22 NOTE — Assessment & Plan Note (Signed)
 -  Continue Lipitor

## 2024-05-22 NOTE — ED Triage Notes (Signed)
 Patient to ED via POV for headache. PT reports it started last night while at work. Having lack of appetite. Denies blurred vision or light sensitivity.

## 2024-05-22 NOTE — Assessment & Plan Note (Deleted)
 Mild DKA suspected initially Beta-hydroxybutyrate was negative UA is pending Endo tool was not needed as GAP closed with insulin  aspart 15 units IV Insulin  via Endo tool discontinued Patient bed changed to telemetry medical, inpatient

## 2024-05-23 ENCOUNTER — Other Ambulatory Visit (HOSPITAL_COMMUNITY): Payer: Self-pay

## 2024-05-23 ENCOUNTER — Telehealth: Payer: Self-pay | Admitting: Physician Assistant

## 2024-05-23 ENCOUNTER — Telehealth (HOSPITAL_COMMUNITY): Payer: Self-pay | Admitting: Pharmacy Technician

## 2024-05-23 DIAGNOSIS — E785 Hyperlipidemia, unspecified: Secondary | ICD-10-CM

## 2024-05-23 DIAGNOSIS — I10 Essential (primary) hypertension: Secondary | ICD-10-CM | POA: Diagnosis not present

## 2024-05-23 DIAGNOSIS — N179 Acute kidney failure, unspecified: Secondary | ICD-10-CM | POA: Diagnosis not present

## 2024-05-23 DIAGNOSIS — E1110 Type 2 diabetes mellitus with ketoacidosis without coma: Secondary | ICD-10-CM | POA: Diagnosis not present

## 2024-05-23 DIAGNOSIS — G629 Polyneuropathy, unspecified: Secondary | ICD-10-CM

## 2024-05-23 DIAGNOSIS — N189 Chronic kidney disease, unspecified: Secondary | ICD-10-CM | POA: Diagnosis not present

## 2024-05-23 DIAGNOSIS — E1165 Type 2 diabetes mellitus with hyperglycemia: Secondary | ICD-10-CM | POA: Diagnosis not present

## 2024-05-23 DIAGNOSIS — R519 Headache, unspecified: Secondary | ICD-10-CM

## 2024-05-23 LAB — BASIC METABOLIC PANEL WITH GFR
Anion gap: 8 (ref 5–15)
Anion gap: 9 (ref 5–15)
BUN: 38 mg/dL — ABNORMAL HIGH (ref 8–23)
BUN: 43 mg/dL — ABNORMAL HIGH (ref 8–23)
CO2: 25 mmol/L (ref 22–32)
CO2: 25 mmol/L (ref 22–32)
Calcium: 9 mg/dL (ref 8.9–10.3)
Calcium: 9 mg/dL (ref 8.9–10.3)
Chloride: 100 mmol/L (ref 98–111)
Chloride: 101 mmol/L (ref 98–111)
Creatinine, Ser: 1.24 mg/dL (ref 0.61–1.24)
Creatinine, Ser: 1.52 mg/dL — ABNORMAL HIGH (ref 0.61–1.24)
GFR, Estimated: 48 mL/min — ABNORMAL LOW (ref >60)
GFR, Estimated: >60 mL/min (ref >60)
Glucose, Bld: 161 mg/dL — ABNORMAL HIGH (ref 70–99)
Glucose, Bld: 201 mg/dL — ABNORMAL HIGH (ref 70–99)
Potassium: 3.9 mmol/L (ref 3.5–5.1)
Potassium: 4.6 mmol/L (ref 3.5–5.1)
Sodium: 134 mmol/L — ABNORMAL LOW (ref 135–145)
Sodium: 134 mmol/L — ABNORMAL LOW (ref 135–145)

## 2024-05-23 LAB — CBC
HCT: 40.4 % (ref 39.0–52.0)
Hemoglobin: 13.7 g/dL (ref 13.0–17.0)
MCH: 28 pg (ref 26.0–34.0)
MCHC: 33.9 g/dL (ref 30.0–36.0)
MCV: 82.6 fL (ref 80.0–100.0)
Platelets: 222 K/uL (ref 150–400)
RBC: 4.89 MIL/uL (ref 4.22–5.81)
RDW: 13.2 % (ref 11.5–15.5)
WBC: 10.4 K/uL (ref 4.0–10.5)
nRBC: 0 % (ref 0.0–0.2)

## 2024-05-23 LAB — GLUCOSE, CAPILLARY
Glucose-Capillary: 162 mg/dL — ABNORMAL HIGH (ref 70–99)
Glucose-Capillary: 178 mg/dL — ABNORMAL HIGH (ref 70–99)

## 2024-05-23 MED ORDER — INSULIN PEN NEEDLE 33G X 4 MM MISC: 1.0000 | Freq: Two times a day (BID) | 0 refills | Status: DC

## 2024-05-23 MED ORDER — INSULIN GLARGINE-YFGN 100 UNIT/ML ~~LOC~~ SOLN
12.0000 [IU] | Freq: Every day | SUBCUTANEOUS | Status: DC
  Administered 2024-05-23: 12 [IU] via SUBCUTANEOUS
  Filled 2024-05-23: qty 0.12

## 2024-05-23 MED ORDER — ONETOUCH ULTRASOFT LANCETS MISC: 0 refills | Status: AC

## 2024-05-23 MED ORDER — GLUCOSE BLOOD VI STRP: 1.0000 | ORAL_STRIP | Freq: Four times a day (QID) | 0 refills | Status: AC

## 2024-05-23 MED ORDER — LIVING WELL WITH DIABETES BOOK
Freq: Once | Status: DC
  Filled 2024-05-23: qty 1

## 2024-05-23 MED ORDER — INSULIN STARTER KIT- PEN NEEDLES (ENGLISH)
1.0000 | Freq: Once | Status: DC
  Filled 2024-05-23: qty 1

## 2024-05-23 MED ORDER — INSULIN LISPRO PROT & LISPRO (75-25 MIX) 100 UNIT/ML KWIKPEN: 8.0000 [IU] | PEN_INJECTOR | Freq: Two times a day (BID) | SUBCUTANEOUS | 0 refills | Status: DC

## 2024-05-23 NOTE — Plan of Care (Signed)
  Problem: Education: Goal: Ability to describe self-care measures that may prevent or decrease complications (Diabetes Survival Skills Education) will improve Outcome: Progressing   Problem: Coping: Goal: Ability to adjust to condition or change in health will improve Outcome: Progressing   Problem: Skin Integrity: Goal: Risk for impaired skin integrity will decrease Outcome: Progressing

## 2024-05-23 NOTE — Assessment & Plan Note (Signed)
 Mild DKA suspected initially with elevated anion gap and elevated sugar. Beta-hydroxybutyrate was negative Patient was given fluids.  Started on Semglee  insulin .  Secondary to affordability we prescribed Humalog  75/25 8 units twice daily starting tomorrow.  Patient was taught how to inject insulin .  Oral medications discontinued

## 2024-05-23 NOTE — Inpatient Diabetes Management (Addendum)
 Inpatient Diabetes Program Recommendations  AACE/ADA: New Consensus Statement on Inpatient Glycemic Control (2015)  Target Ranges:  Prepandial:   less than 140 mg/dL      Peak postprandial:   less than 180 mg/dL (1-2 hours)      Critically ill patients:  140 - 180 mg/dL   Lab Results  Component Value Date   GLUCAP 162 (H) 05/23/2024   HGBA1C 13.6 (H) 05/22/2024    Review of Glycemic Control  Latest Reference Range & Units 05/22/24 15:03 05/22/24 21:17 05/23/24 07:54  Glucose-Capillary 70 - 99 mg/dL 432 (HH) 791 (H) 837 (H)   Diabetes history: DM 2 Outpatient Diabetes medications:  Farxiga , Ozempic - NOT TAKING Current orders for Inpatient glycemic control:  Novolog  0-9 units tid with meals and HS  Inpatient Diabetes Program Recommendations:    Note elevated blood sugars and A1C.  Patient needs insulin  at discharge due to elevated A1C indicating average blood sugar of 343 mg/dL.  Will see patient today.   Addendum:  For discharge, recommend Humalog  75/25- 8 units bid with breakfast and evening meal.  This will be 35$ monthly.  Will meet with patient and wife this morning to discuss insulin  administration, monitoring, etc.    Thanks,  Randall Bullocks, RN, BC-ADM Inpatient Diabetes Coordinator Pager [phone]  (8a-5p)

## 2024-05-23 NOTE — Progress Notes (Signed)
Per Dr Renae Gloss, dc tele monitoring

## 2024-05-23 NOTE — Discharge Instructions (Signed)
 Check your sugars four times a day

## 2024-05-23 NOTE — Discharge Summary (Signed)
 Physician Discharge Summary   Patient: Matthew Gay MRN: [id number] DOB: 12-30-1950  Admit date:     05/22/2024  Discharge date: 05/23/24  Discharge Physician: Charlie Patterson   PCP: Kristina [name], PA-C   Recommendations at discharge:   Follow-up PCP  Discharge Diagnoses: Active Problems:   Uncontrolled type 2 diabetes mellitus with hyperglycemia (HCC)   Acute kidney injury superimposed on CKD (HCC)   Headache   Hypertension   Primary insomnia   Erectile dysfunction   Dysuria   Hyperlipidemia   Neuropathy    Hospital Course: Matthew Gay is a 73 year old male with history of insulin -dependent diabetes mellitus, poorly controlled, hypertension, hyperlipidemia, insomnia, who presents emergency department for chief concerns of headache.  Vitals in the ED showed temperature of 98, respiration rate 17, heart rate 99, blood pressure 112/72, SpO2 100% on room air.  Serum sodium is 122, potassium 4.7, chloride 84, bicarb 23, BUN of 54, serum creatinine of 2.50, EGFR of 27, nonfasting blood glucose 787, WBC 13.9, hemoglobin 15.1, platelets of 251.  Anion gap was elevated at 15.  ED treatment: Sodium chloride  1 L bolus.  7/8.  Patient was given Semglee  insulin  this morning 12 units.  Case discussed with diabetes coordinator since cost is an issue will do 75/25 Humalog  mix pen 8 units twice a day starting tomorrow.  Patient was taught how to inject insulin .  His wife also injects insulin  so we felt comfortable discharging home today.  Assessment and Plan: Uncontrolled type 2 diabetes mellitus with hyperglycemia (HCC) Mild DKA suspected initially with elevated anion gap and elevated sugar. Beta-hydroxybutyrate was negative Patient was given fluids.  Started on Semglee  insulin .  Secondary to affordability we prescribed Humalog  75/25 8 units twice daily starting tomorrow.  Patient was taught how to inject insulin .  Oral medications discontinued  Acute kidney injury  superimposed on CKD (HCC) Creatinine 2.5 upon admission and creatinine 1.24 upon discharge.  AKI on CKD stage II.  Headache Resolved  Hypertension Can go back on lisinopril  hydrochlorothiazide  as outpatient since creatinine has improved.  Neuropathy Continue gabapentin   Hyperlipidemia Continue Lipitor  Dysuria Urine analysis negative for infection.  Did have glucose in the urine.         Consultants: Diabetes coordinator Procedures performed: None Disposition: Home Diet recommendation:  Cardiac and Carb modified diet DISCHARGE MEDICATION: Allergies as of 05/23/2024   No Known Allergies      Medication List     STOP taking these medications    acetaminophen -codeine  300-30 MG tablet Commonly known as: TYLENOL  #3   dapagliflozin  propanediol 10 MG Tabs tablet Commonly known as: Farxiga    glimepiride  4 MG tablet Commonly known as: AMARYL    ibuprofen  800 MG tablet Commonly known as: ADVIL    Semaglutide (0.25 or 0.5MG /DOS) 2 MG/3ML Sopn   tadalafil  20 MG tablet Commonly known as: CIALIS    tizanidine 2 MG capsule Commonly known as: ZANAFLEX       TAKE these medications    atorvastatin  20 MG tablet Commonly known as: LIPITOR Take 1 tablet(s) by mouth at bedtime for cholesterol.   gabapentin  100 MG capsule Commonly known as: NEURONTIN  TAKE ONE CAPSULE BY MOUTH TWICE A DAY FOR DIABETIC NEUROPATHY   glucose blood test strip 1 each by Other route 4 (four) times daily. Check blood sugars once daily and as needed.  E11.65 What changed: when to take this   Insulin  Lispro Prot & Lispro (75-25) 100 UNIT/ML Kwikpen Commonly known as: HumaLOG  Mix 75/25 KwikPen Inject 8 Units  into the skin 2 (two) times daily before a meal.   Insulin  Pen Needle 33G X 4 MM Misc 1 Dose by Does not apply route 2 (two) times daily.   lisinopril -hydrochlorothiazide  20-12.5 MG tablet Commonly known as: ZESTORETIC  TAKE 1 TABLET BY MOUTH EVERY DAY FOR BLOOD PRESSURE   ONE TOUCH  ULTRA MINI w/Device Kit Use as directed diagnosis e11.65   onetouch ultrasoft lancets Use as instructed   traZODone  50 MG tablet Commonly known as: DESYREL  TAKE 1/2 TO 1 TABLET (25 TO 50 MG TOTAL) BY MOUTH AT BEDTIME        Follow-up Information     [name], [name], PA-C Follow up in 5 day(s).   Specialty: Physician Assistant Contact information: [phone] [place] [address] [phone]                Discharge Exam: Filed Weights   05/22/24 1026  Weight: 70.8 kg   Physical Exam HENT:     Head: Normocephalic.     Mouth/Throat:     Pharynx: No oropharyngeal exudate.  Eyes:     General: Lids are normal.     Conjunctiva/sclera: Conjunctivae normal.  Cardiovascular:     Rate and Rhythm: Normal rate and regular rhythm.     Heart sounds: Normal heart sounds, S1 normal and S2 normal.  Pulmonary:     Breath sounds: No decreased breath sounds, wheezing, rhonchi or rales.  Abdominal:     Palpations: Abdomen is soft.     Tenderness: There is no abdominal tenderness.  Musculoskeletal:     Right lower leg: No swelling.     Left lower leg: No swelling.  Skin:    General: Skin is warm.     Findings: No rash.  Neurological:     Mental Status: He is alert.      Condition at discharge: stable  The results of significant diagnostics from this hospitalization (including imaging, microbiology, ancillary and laboratory) are listed below for reference.   Imaging Studies: Portable chest x-ray (1 view) Result Date: 05/22/2024 CLINICAL DATA:  [id number] DKA (diabetic ketoacidosis) (HCC) [id number] EXAM: PORTABLE CHEST 1 VIEW COMPARISON:  Chest x-ray 04/05/2008 FINDINGS: Patient is rotated The heart and mediastinal contours are within normal limits. No focal consolidation. No pulmonary edema. No pleural effusion. No pneumothorax. No acute osseous abnormality. IMPRESSION: No active disease. Electronically Signed   By: Morgane  Naveau M.D.   On: 05/22/2024 16:59   CT Head  Wo Contrast Result Date: 05/22/2024 CLINICAL DATA:  Headache starting last night while working. Lack of appetite. EXAM: CT HEAD WITHOUT CONTRAST TECHNIQUE: Contiguous axial images were obtained from the base of the skull through the vertex without intravenous contrast. RADIATION DOSE REDUCTION: This exam was performed according to the departmental dose-optimization program which includes automated exposure control, adjustment of the mA and/or kV according to patient size and/or use of iterative reconstruction technique. COMPARISON:  None Available. FINDINGS: Brain: No acute intracranial hemorrhage. No CT evidence of acute infarct. Nonspecific hypoattenuation in the periventricular and subcortical white matter favored to reflect chronic microvascular ischemic changes. Mild parenchymal volume loss. No edema, mass effect, or midline shift. The basilar cisterns are patent. Ventricles: The ventricles are normal. Vascular: No hyperdense vessel or unexpected calcification. Skull: No acute or aggressive finding. Orbits: Orbits are symmetric. Sinuses: The visualized paranasal sinuses are clear. Other: Mastoid air cells are clear. IMPRESSION: No CT evidence of acute intracranial abnormality. Chronic microvascular ischemic changes. Electronically Signed   By: Donnice Mania  M.D.   On: 05/22/2024 12:59    Microbiology: Results for orders placed or performed in visit on 05/27/23  Microscopic Examination     Status: None   Collection Time: 05/27/23  3:50 PM   Urine  Result Value Ref Range Status   WBC, UA None seen 0 - 5 /hpf Final   RBC, Urine None seen 0 - 2 /hpf Final   Epithelial Cells (non renal) None seen 0 - 10 /hpf Final   Casts None seen None seen /lpf Final   Bacteria, UA None seen None seen/Few Final    Labs: CBC: Recent Labs  Lab 05/22/24 1027 05/23/24 0346  WBC 13.9* 10.4  NEUTROABS 12.1*  --   HGB 15.1 13.7  HCT 44.1 40.4  MCV 83.5 82.6  PLT 251 222   Basic Metabolic Panel: Recent Labs   Lab 05/22/24 1027 05/22/24 1635 05/22/24 2117 05/22/24 2352 05/23/24 0346  NA 122* 134* 131* 134* 134*  K 4.7 3.7 4.0 3.9 4.6  CL 84* 95* 97* 100 101  CO2 23 25 23 25 25   GLUCOSE 787* 207* 198* 201* 161*  BUN 54* 50* 45* 43* 38*  CREATININE 2.50* 1.99* 1.42* 1.52* 1.24  CALCIUM  9.3 9.5 8.6* 9.0 9.0  MG  --  2.6*  --   --   --   PHOS  --  3.8  --   --   --    Liver Function Tests: No results for input(s): AST, ALT, ALKPHOS, BILITOT, PROT, ALBUMIN in the last 168 hours. CBG: Recent Labs  Lab 05/22/24 1503 05/22/24 2117 05/23/24 0754 05/23/24 1143  GLUCAP 567* 208* 162* 178*    Discharge time spent: greater than 30 minutes.  Signed: Charlie Patterson, MD [facility] 05/23/2024

## 2024-05-23 NOTE — Assessment & Plan Note (Signed)
 Resolved

## 2024-05-23 NOTE — TOC Transition Note (Signed)
 Transition of Care [facility]) - Discharge Note   Patient Details  Name: Matthew Gay MRN: [id number] Date of Birth: 03-23-1951  [facility]) CM/SW Contact:  Elouise LULLA Capri, RN 05/23/2024, 4:29 PM   Clinical Narrative:     Patient discharged to home/self care.   Final next level of care: Home/Self Care Barriers to Discharge: No Barriers Identified   Patient Goals and CMS Choice    Home/self care  Discharge Placement    Home/self care            Discharge Plan and Services Additional resources added to the After Visit Summary for     Social Drivers of Health (SDOH) Interventions SDOH Screenings   Food Insecurity: No Food Insecurity (05/22/2024)  Housing: Low Risk  (05/22/2024)  Transportation Needs: No Transportation Needs (05/22/2024)  Utilities: Not At Risk (05/22/2024)  Alcohol Screen: Low Risk  (09/30/2023)  Depression (PHQ2-9): Low Risk  (09/30/2023)  Social Connections: Moderately Isolated (05/22/2024)  Tobacco Use: High Risk (05/22/2024)     Readmission Risk Interventions     No data to display

## 2024-05-23 NOTE — Telephone Encounter (Signed)
 Patient Product/process development scientist completed.    The patient is insured through [employer]. Patient has Medicare and is not eligible for a copay card, but may be able to apply for patient assistance or Medicare RX Payment Plan (Patient Must reach out to their plan, if eligible for payment plan), if available.    Ran test claim for Lantus  Pen and the current 30 day co-pay is $35.00.  Ran test claim for Humalog  KwikPen and the current 30 day co-pay is $35.00.  Ran test claim for Humalog  75-25 KwikPen and the current 30 day co-pay is $35.00.  This test claim was processed through [facility]- copay amounts may vary at other pharmacies due to pharmacy/plan contracts, or as the patient moves through the different stages of their insurance plan.     Reyes Sharps, CPHT Pharmacy Technician III Certified Patient Advocate [facility] Patient Advocate Team Direct Number: [phone]  Fax: [phone]

## 2024-05-23 NOTE — Telephone Encounter (Signed)
 No vm, sent mychart message to confirm 05/26/24 appointment-Toni

## 2024-05-23 NOTE — Progress Notes (Addendum)
 Pt educated on self insulin  administration, pt demonstrates with instruction and guidance ability to self administer insulin , will continue to educate pt, diabetes coordinator will also be consulting

## 2024-05-23 NOTE — Assessment & Plan Note (Signed)
 Continue gabapentin.

## 2024-05-23 NOTE — Assessment & Plan Note (Signed)
 Creatinine 2.5 upon admission and creatinine 1.24 upon discharge.  AKI on CKD stage II.

## 2024-05-25 ENCOUNTER — Encounter: Payer: Self-pay | Admitting: Physician Assistant

## 2024-05-25 ENCOUNTER — Ambulatory Visit (INDEPENDENT_AMBULATORY_CARE_PROVIDER_SITE_OTHER): Payer: Medicare (Managed Care) | Admitting: Physician Assistant

## 2024-05-25 VITALS — BP 133/80 | HR 86 | Temp 98.3°F | Resp 16 | Ht 69.0 in | Wt 155.4 lb

## 2024-05-25 DIAGNOSIS — I10 Essential (primary) hypertension: Secondary | ICD-10-CM

## 2024-05-25 DIAGNOSIS — E1165 Type 2 diabetes mellitus with hyperglycemia: Secondary | ICD-10-CM

## 2024-05-25 DIAGNOSIS — Z09 Encounter for follow-up examination after completed treatment for conditions other than malignant neoplasm: Secondary | ICD-10-CM | POA: Diagnosis not present

## 2024-05-25 DIAGNOSIS — N179 Acute kidney failure, unspecified: Secondary | ICD-10-CM | POA: Diagnosis not present

## 2024-05-25 NOTE — Progress Notes (Signed)
 [facility] [address] [place], [place] [address]  Internal MEDICINE  Office Visit Note  Patient Name: Matthew Gay  [id number]  [id number]  Date of Service: 05/25/2024     Chief Complaint  Patient presents with   Hospitalization Follow-up   Hyperglycemia     HPI Pt is here for recent hospital follow up. Matthew Gay to ED on 05/22/24 with a headache and found to have very elevated BG and abnormal renal functions -Given IV fluids in ED and admitted for 1 night, discharged on 05/23/24 -He was given semglee  12units prior to discharge, but was prescribed 75/25 humalog  mix due to cost concerns at 8units BID -Started on insulin  this morning and took 8units -all other DM meds held when insulin  added and likely due to AKI -BG this morning around 400 -had been drinking more sodas and is stopping this and tea. Just water now -had never started on ozempic , did have Farxiga  prior to hospital visit, but admits to poor diet and not checking BG at home. Unfortunately he had also missed his last few appts and discussed pharmacy attempts to contact him to help with med costs but werent able to reach him very much. He admits to not answering and thinking possible scam calls at times. He is going to do better with appts and reaching out for help when needed -BP stable, he is taking his medication -will retart at 1/2 tab farxiga  since kidney function better and monitor BG closely. May titrate up in future but will see how BG respond to insulin  mix  Current Medication: Outpatient Encounter Medications as of 05/25/2024  Medication Sig   atorvastatin  (LIPITOR) 20 MG tablet Take 1 tablet(s) by mouth at bedtime for cholesterol.   Blood Glucose Monitoring Suppl (ONE TOUCH ULTRA MINI) w/Device KIT Use as directed diagnosis e11.65   dapagliflozin  propanediol (FARXIGA ) 10 MG TABS tablet Take 5 mg by mouth daily.   gabapentin  (NEURONTIN ) 100 MG capsule TAKE ONE CAPSULE BY MOUTH TWICE A DAY FOR  DIABETIC NEUROPATHY   glucose blood test strip 1 each by Other route 4 (four) times daily. Check blood sugars once daily and as needed.  E11.65   Insulin  Lispro Prot & Lispro (HUMALOG  MIX 75/25 KWIKPEN) (75-25) 100 UNIT/ML Kwikpen Inject 8 Units into the skin 2 (two) times daily before a meal.   Insulin  Pen Needle 33G X 4 MM MISC 1 Dose by Does not apply route 2 (two) times daily.   Lancets (ONETOUCH ULTRASOFT) lancets Use as instructed   lisinopril -hydrochlorothiazide  (ZESTORETIC ) 20-12.5 MG tablet TAKE 1 TABLET BY MOUTH EVERY DAY FOR BLOOD PRESSURE   traZODone  (DESYREL ) 50 MG tablet TAKE 1/2 TO 1 TABLET (25 TO 50 MG TOTAL) BY MOUTH AT BEDTIME   No facility-administered encounter medications on file as of 05/25/2024.    Surgical History: Past Surgical History:  Procedure Laterality Date   colon cancer     COLON SURGERY      Medical History: Past Medical History:  Diagnosis Date   Diabetes mellitus without complication (HCC)    Hyperlipemia    Hypertension     Family History: Family History  Problem Relation Age of Onset   Hypertension Father     Social History   Socioeconomic History   Marital status: Married    Spouse name: Not on file   Number of children: Not on file   Years of education: Not on file   Highest education level: Not on file  Occupational History  Not on file  Tobacco Use   Smoking status: Former    Types: Cigars   Smokeless tobacco: Never   Tobacco comments:    Quit 2025.  Vaping Use   Vaping status: Never Used  Substance and Sexual Activity   Alcohol use: Yes    Comment: Rarely   Drug use: No   Sexual activity: Not Currently  Other Topics Concern   Not on file  Social History Narrative   Not on file   Social Drivers of Health   Financial Resource Strain: Not on file  Food Insecurity: No Food Insecurity (05/22/2024)   Hunger Vital Sign    Worried About Running Out of Food in the Last Year: Never true    Ran Out of Food in the Last  Year: Never true  Transportation Needs: No Transportation Needs (05/22/2024)   PRAPARE - Administrator, Civil Service (Medical): No    Lack of Transportation (Non-Medical): No  Physical Activity: Not on file  Stress: Not on file  Social Connections: Moderately Isolated (05/22/2024)   Social Connection and Isolation Panel    Frequency of Communication with Friends and Family: Three times a week    Frequency of Social Gatherings with Friends and Family: Three times a week    Attends Religious Services: Never    Active Member of Clubs or Organizations: No    Attends Banker Meetings: Never    Marital Status: Married  Catering manager Violence: Not At Risk (05/22/2024)   Humiliation, Afraid, Rape, and Kick questionnaire    Fear of Current or Ex-Partner: No    Emotionally Abused: No    Physically Abused: No    Sexually Abused: No      Review of Systems  Constitutional:  Negative for chills, fatigue and unexpected weight change.  HENT:  Negative for congestion, postnasal drip, rhinorrhea, sneezing and sore throat.   Eyes:  Negative for redness.  Respiratory:  Negative for cough, chest tightness and shortness of breath.   Cardiovascular:  Negative for chest pain and palpitations.  Gastrointestinal:  Negative for abdominal pain, constipation, diarrhea, nausea and vomiting.  Genitourinary:  Negative for dysuria and frequency.  Musculoskeletal:  Negative for back pain, joint swelling and neck pain.  Skin:  Negative for rash.  Neurological: Negative.  Negative for tremors and numbness.  Hematological:  Negative for adenopathy. Does not bruise/bleed easily.  Psychiatric/Behavioral:  Negative for behavioral problems (Depression), sleep disturbance and suicidal ideas. The patient is not nervous/anxious.     Vital Signs: BP 133/80   Pulse 86   Temp 98.3 F (36.8 C)   Resp 16   Ht 5' 9 (1.753 m)   Wt 155 lb 6.4 oz (70.5 kg)   SpO2 100%   BMI 22.95 kg/m     Physical Exam Vitals and nursing note reviewed.  Constitutional:      General: He is not in acute distress.    Appearance: Normal appearance. He is well-developed and normal weight. He is not diaphoretic.  HENT:     Head: Normocephalic and atraumatic.  Eyes:     Pupils: Pupils are equal, round, and reactive to light.  Neck:     Thyroid : No thyromegaly.     Vascular: No JVD.     Trachea: No tracheal deviation.  Cardiovascular:     Rate and Rhythm: Normal rate and regular rhythm.     Heart sounds: Normal heart sounds. No murmur heard.    No friction rub.  No gallop.  Pulmonary:     Effort: Pulmonary effort is normal. No respiratory distress.     Breath sounds: No wheezing or rales.  Chest:     Chest wall: No tenderness.  Musculoskeletal:        General: Normal range of motion.  Skin:    General: Skin is warm and dry.  Neurological:     Mental Status: He is alert and oriented to person, place, and time.  Psychiatric:        Behavior: Behavior normal.        Thought Content: Thought content normal.        Judgment: Judgment normal.       Assessment/Plan: 1. Type 2 diabetes mellitus with hyperglycemia, unspecified whether long term insulin  use (HCC) (Primary) Continue insulin  mix as prescribed from hospital and will restart 1/2 tab Farxiga  now that renal function improved to help BG further. Will monitor closely and keep log. Follow up in 2 weeks with log  2. AKI (acute kidney injury) (HCC) Improved on discharge, continue to drink water and monitor BG  3. Hospital discharge follow-up Reviewed hospital course and med changes  4. Essential hypertension Stable, continue current medication   General Counseling: Lori verbalizes understanding of the findings of todays visit and agrees with plan of treatment. I have discussed any further diagnostic evaluation that may be needed or ordered today. We also reviewed his medications today. he has been encouraged to call the  office with any questions or concerns that should arise related to todays visit.    Counseling:    No orders of the defined types were placed in this encounter.   This patient was seen by Tinnie Pro, PA-C in collaboration with Dr. Sigrid Bathe as a part of collaborative care agreement.   I have reviewed all medical records from hospital follow up including radiology reports and consults from other physicians. Appropriate follow up diagnostics will be scheduled as needed. Patient/ Family understands the plan of treatment. Time spent 35 minutes.   Dr Sigrid CHRISTELLA Bathe, MD Internal Medicine

## 2024-05-26 ENCOUNTER — Emergency Department
Admission: EM | Admit: 2024-05-26 | Discharge: 2024-05-26 | Disposition: A | Discharge status: Home / Self Care | Payer: Medicare (Managed Care) | Attending: Emergency Medicine | Admitting: Emergency Medicine

## 2024-05-26 ENCOUNTER — Other Ambulatory Visit: Payer: Self-pay

## 2024-05-26 ENCOUNTER — Telehealth: Payer: Self-pay

## 2024-05-26 DIAGNOSIS — I10 Essential (primary) hypertension: Secondary | ICD-10-CM | POA: Diagnosis not present

## 2024-05-26 DIAGNOSIS — E1165 Type 2 diabetes mellitus with hyperglycemia: Secondary | ICD-10-CM | POA: Diagnosis not present

## 2024-05-26 DIAGNOSIS — R739 Hyperglycemia, unspecified: Secondary | ICD-10-CM

## 2024-05-26 LAB — BASIC METABOLIC PANEL WITH GFR
Anion gap: 11 (ref 5–15)
BUN: 25 mg/dL — ABNORMAL HIGH (ref 8–23)
CO2: 24 mmol/L (ref 22–32)
Calcium: 9.5 mg/dL (ref 8.9–10.3)
Chloride: 94 mmol/L — ABNORMAL LOW (ref 98–111)
Creatinine, Ser: 1.22 mg/dL (ref 0.61–1.24)
GFR, Estimated: >60 mL/min (ref >60)
Glucose, Bld: 420 mg/dL — ABNORMAL HIGH (ref 70–99)
Potassium: 4.1 mmol/L (ref 3.5–5.1)
Sodium: 129 mmol/L — ABNORMAL LOW (ref 135–145)

## 2024-05-26 LAB — CBC
HCT: 41.3 % (ref 39.0–52.0)
Hemoglobin: 14 g/dL (ref 13.0–17.0)
MCH: 27.9 pg (ref 26.0–34.0)
MCHC: 33.9 g/dL (ref 30.0–36.0)
MCV: 82.3 fL (ref 80.0–100.0)
Platelets: 210 K/uL (ref 150–400)
RBC: 5.02 MIL/uL (ref 4.22–5.81)
RDW: 13.4 % (ref 11.5–15.5)
WBC: 7.7 K/uL (ref 4.0–10.5)
nRBC: 0 % (ref 0.0–0.2)

## 2024-05-26 LAB — URINALYSIS, ROUTINE W REFLEX MICROSCOPIC
Bacteria, UA: NONE SEEN
Bilirubin Urine: NEGATIVE
Glucose, UA: >=500 mg/dL — AB
Hgb urine dipstick: NEGATIVE
Ketones, ur: NEGATIVE mg/dL
Leukocytes,Ua: NEGATIVE
Nitrite: NEGATIVE
Protein, ur: NEGATIVE mg/dL
RBC / HPF: 0 RBC/hpf (ref 0–5)
Specific Gravity, Urine: 1.022 (ref 1.005–1.030)
Squamous Epithelial / HPF: 0 /HPF (ref 0–5)
WBC, UA: 0 WBC/hpf (ref 0–5)
pH: 7 (ref 5.0–8.0)

## 2024-05-26 LAB — CBG MONITORING, ED
Glucose-Capillary: 381 mg/dL — ABNORMAL HIGH (ref 70–99)
Glucose-Capillary: 355 mg/dL — ABNORMAL HIGH (ref 70–99)

## 2024-05-26 MED ORDER — IBUPROFEN 600 MG PO TABS
600.0000 mg | ORAL_TABLET | Freq: Once | ORAL | Status: AC
  Administered 2024-05-26: 600 mg via ORAL
  Filled 2024-05-26: qty 1

## 2024-05-26 MED ORDER — SODIUM CHLORIDE 0.9 % IV BOLUS
500.0000 mL | Freq: Once | INTRAVENOUS | Status: AC
  Administered 2024-05-26: 500 mL via INTRAVENOUS

## 2024-05-26 MED ORDER — INSULIN LISPRO PROT & LISPRO (75-25 MIX) 100 UNIT/ML KWIKPEN: 12.0000 [IU] | PEN_INJECTOR | Freq: Two times a day (BID) | SUBCUTANEOUS | 0 refills | Status: DC

## 2024-05-26 NOTE — ED Triage Notes (Signed)
 Pt comes via EMs from home with hyperglycemia. Pt was just here and admitted and recently dc. Pt sugar was over 700s.  Pt states last read was 599 on his meter. CBG was 407 on EMS.   Pt states he took meds at 7am.

## 2024-05-26 NOTE — Discharge Instructions (Addendum)
 The diabetic coordinator has seen you today and recommended you increase your insulin  from 8 units twice daily to 12 units twice daily.  Please make this adjustment and follow-up with your regular provider in the next couple of days for ongoing assessment of your blood sugar.  Please return for any severe worsening symptoms concerning for ongoing severe elevated blood sugars.

## 2024-05-26 NOTE — ED Provider Notes (Signed)
 [facility] Provider Note    Event Date/Time   First MD Initiated Contact with Patient 05/26/24 1237     (approximate)   History   Hyperglycemia   HPI Matthew Gay is a 74 y.o. male with history of HTN, DM2 presenting today for hyperglycemia.  Patient comes from home for reported hyperglycemia.  Reports his last BG was 599 at home.  Blood glucose with EMS was 407.  States he did take his normal meds at 7 AM this morning.  States mild intermittent headache but that has been present for a while now and not severe.  Otherwise denies cough, congestion, nausea, vomiting, abdominal pain, dysuria, diarrhea, constipation.  Reports he has been taking his diabetic medications as prescribed.  Chart review: Patient did have admission on 05/22/2024 and discharged 3 days ago for hyperglycemia with suspected mild DKA.     Physical Exam   Triage Vital Signs: ED Triage Vitals  Encounter Vitals Group     BP 05/26/24 1151 120/70     Girls Systolic BP Percentile --      Girls Diastolic BP Percentile --      Boys Systolic BP Percentile --      Boys Diastolic BP Percentile --      Pulse Rate 05/26/24 1151 92     Resp 05/26/24 1151 17     Temp 05/26/24 1151 98 F (36.7 C)     Temp src --      SpO2 05/26/24 1151 100 %     Weight 05/26/24 1130 155 lb 6.4 oz (70.5 kg)     Height 05/26/24 1130 5' 9 (1.753 m)     Head Circumference --      Peak Flow --      Pain Score --      Pain Loc --      Pain Education --      Exclude from Growth Chart --     Most recent vital signs: Vitals:   05/26/24 1151  BP: 120/70  Pulse: 92  Resp: 17  Temp: 98 F (36.7 C)  SpO2: 100%   I have reviewed the vital signs. General:  Awake, alert, no acute distress. Head:  Normocephalic, Atraumatic. EENT:  PERRL, EOMI, Oral mucosa pink and moist, Neck is supple. Cardiovascular: Regular rate, 2+ distal pulses. Respiratory:  Normal respiratory effort, symmetrical expansion, no distress.    Extremities:  Moving all four extremities through full ROM without pain.   Neuro:  Alert and oriented.  Interacting appropriately.   Skin:  Warm, dry, no rash.   Psych: Appropriate affect.    ED Results / Procedures / Treatments   Labs (all labs ordered are listed, but only abnormal results are displayed) Labs Reviewed  BASIC METABOLIC PANEL WITH GFR - Abnormal; Notable for the following components:      Result Value   Sodium 129 (*)    Chloride 94 (*)    Glucose, Bld 420 (*)    BUN 25 (*)    All other components within normal limits  URINALYSIS, ROUTINE W REFLEX MICROSCOPIC - Abnormal; Notable for the following components:   Color, Urine STRAW (*)    APPearance CLEAR (*)    Glucose, UA >=500 (*)    All other components within normal limits  CBG MONITORING, ED - Abnormal; Notable for the following components:   Glucose-Capillary 381 (*)    All other components within normal limits  CBG MONITORING, ED - Abnormal; Notable for  the following components:   Glucose-Capillary 355 (*)    All other components within normal limits  CBC     EKG    RADIOLOGY    PROCEDURES:  Critical Care performed: No  Procedures   MEDICATIONS ORDERED IN ED: Medications  sodium chloride  0.9 % bolus 500 mL (0 mLs Intravenous Stopped 05/26/24 1253)  ibuprofen  (ADVIL ) tablet 600 mg (600 mg Oral Given 05/26/24 1258)     IMPRESSION / MDM / ASSESSMENT AND PLAN / ED COURSE  I reviewed the triage vital signs and the nursing notes.                              Differential diagnosis includes, but is not limited to, hyperglycemia, electrolyte abnormality, dehydration, less likely DKA  Patient's presentation is most consistent with exacerbation of chronic illness.  Patient is a 73 year old male presents today for hyperglycemia.  On arrival he is essentially asymptomatic outside of a mild headache which is baseline for him.  Vital signs are stable.  Blood sugar slightly elevated at 381.  CBC  unremarkable.  BMP does not show any evidence of DKA at this time and improved kidney function.  Patient was initially given 500 cc bolus of fluid with improvement and blood glucose of 355. Ibuprofen  given for headache. UA shows glycosuria but no ketones. Diabetic coordinator evaluated patient.  She recommends increasing his insulin  regimen from 8 units twice daily to 12 units twice daily.  This adjustment was made and patient was safe for discharge with follow-up with his PCP for reevaluation early next week.  Given strict return precautions.     FINAL CLINICAL IMPRESSION(S) / ED DIAGNOSES   Final diagnoses:  Hyperglycemia     Rx / DC Orders   ED Discharge Orders          Ordered    Insulin  Lispro Prot & Lispro (HUMALOG  MIX 75/25 KWIKPEN) (75-25) 100 UNIT/ML Kwikpen  2 times daily before meals        05/26/24 1329             Note:  This document was prepared using Dragon voice recognition software and may include unintentional dictation errors.   [name], MD 05/26/24 1330

## 2024-05-26 NOTE — Telephone Encounter (Signed)
 Patient called stating his blood sugar is 600 after taking his Farxiga  and his insulin , patient spoke to Lauren and stated he has ate and took medicine and that he has a slight headache per Tinnie he is to call 911 and go back to the ER.

## 2024-05-26 NOTE — ED Notes (Signed)
Patient ambulated to hallway bathroom with a steady gait. 

## 2024-05-26 NOTE — Inpatient Diabetes Management (Signed)
 Inpatient Diabetes Program Recommendations  AACE/ADA: New Consensus Statement on Inpatient Glycemic Control (2015)  Target Ranges:  Prepandial:   less than 140 mg/dL      Peak postprandial:   less than 180 mg/dL (1-2 hours)      Critically ill patients:  140 - 180 mg/dL   Lab Results  Component Value Date   GLUCAP 355 (H) 05/26/2024   HGBA1C 13.6 (H) 05/22/2024    Review of Glycemic Control  Latest Reference Range & Units 05/26/24 11:51 05/26/24 12:50  Glucose-Capillary 70 - 99 mg/dL 618 (H) 644 (H)   Diabetes history: DM 2 Outpatient Diabetes medications:  Farxiga  10 mg daily Humalog  75/25- 8 units bid Current orders for Inpatient glycemic control:  None  Inpatient Diabetes Program Recommendations:    Patient states that he worked last night.  He came home this morning and took his insulin  and at a biscuit.  States that blood sugar was 599 when he checked it at home about 2 hours after eating and taking insulin .  He was instructed to come to the ED.  Patient took first dose of 75/25 on Wednesday afternoon and saw PCP on 05/25/24.  Discussed possibility of not eating biscuit for breakfast and trying to eat more protein/whole grain bread.  He says he feels ok. Discussed importance of drinking water and close f/u with PCP.  Recommend increasing 70/30 to 12 units bid.  Discussed with MD.    Thanks,  Randall Bullocks, RN, BC-ADM Inpatient Diabetes Coordinator Pager [phone]  (8a-5p)

## 2024-05-29 ENCOUNTER — Ambulatory Visit: Payer: Medicare (Managed Care) | Admitting: Physician Assistant

## 2024-05-29 ENCOUNTER — Telehealth: Payer: Self-pay

## 2024-05-29 NOTE — Progress Notes (Unsigned)
 Complex Care Management Note Care Guide Note  05/29/2024 Name: Matthew Gay MRN: [id number] DOB: [birth year]   Complex Care Management Outreach Attempts: An unsuccessful telephone outreach was attempted today to offer the patient information about available complex care management services.  Follow Up Plan:  Additional outreach attempts will be made to offer the patient complex care management information and services.   Encounter Outcome:  Patient Request to Call Back  Dreama [facility]  [facility], [facility] Health Care Management Assistant Direct Dial: [phone]  Fax: [phone]

## 2024-05-31 NOTE — Progress Notes (Unsigned)
 Complex Care Management Note Care Guide Note  05/31/2024 Name: Matthew Gay MRN: [id number] DOB: 07-24-1951   Complex Care Management Outreach Attempts: A second unsuccessful outreach was attempted today to offer the patient with information about available complex care management services.  Follow Up Plan:  Additional outreach attempts will be made to offer the patient complex care management information and services.   Encounter Outcome:  No Answer  Dreama [facility]  [facility], [facility] Health Care Management Assistant Direct Dial: [phone]  Fax: [phone]

## 2024-06-02 NOTE — Progress Notes (Signed)
 Complex Care Management Note  Care Guide Note 06/02/2024 Name: [name] MRN: [id number] DOB: [birth year]  Matthew Gay is a 73 y.o. year old male who sees [name], [name], PA-C for primary care. I reached out to Matthew Gay by phone today to offer complex care management services.  Mr. Senters was given information about Complex Care Management services today including:   The Complex Care Management services include support from the care team which includes your Nurse Care Manager, Clinical Social Worker, or Pharmacist.  The Complex Care Management team is here to help remove barriers to the health concerns and goals most important to you. Complex Care Management services are voluntary, and the patient may decline or stop services at any time by request to their care team member.   Complex Care Management Consent Status: Patient agreed to services and verbal consent obtained.   Follow up plan:  Telephone appointment with complex care management team member scheduled for:  06/08/24 at 9:00 a.m.   Encounter Outcome:  Patient Scheduled  Dreama [facility]  [facility], [facility] Health Care Management Assistant Direct Dial: [phone]  Fax: [phone]

## 2024-06-08 ENCOUNTER — Encounter: Payer: Self-pay | Admitting: Physician Assistant

## 2024-06-08 ENCOUNTER — Ambulatory Visit (INDEPENDENT_AMBULATORY_CARE_PROVIDER_SITE_OTHER): Payer: Medicare (Managed Care) | Admitting: Physician Assistant

## 2024-06-08 ENCOUNTER — Other Ambulatory Visit: Payer: Medicare (Managed Care)

## 2024-06-08 VITALS — BP 136/62 | HR 86 | Temp 98.4°F | Resp 16 | Ht 69.0 in | Wt 158.8 lb

## 2024-06-08 DIAGNOSIS — E1165 Type 2 diabetes mellitus with hyperglycemia: Secondary | ICD-10-CM

## 2024-06-08 DIAGNOSIS — H9193 Unspecified hearing loss, bilateral: Secondary | ICD-10-CM

## 2024-06-08 DIAGNOSIS — I10 Essential (primary) hypertension: Secondary | ICD-10-CM | POA: Diagnosis not present

## 2024-06-08 DIAGNOSIS — Z794 Long term (current) use of insulin: Secondary | ICD-10-CM | POA: Diagnosis not present

## 2024-06-08 NOTE — Progress Notes (Signed)
 06/08/2024 Name: Matthew Gay MRN: [id number] DOB: [birth year]  Chief Complaint  Patient presents with   Diabetes    Matthew Gay is a 73 y.o. year old male who presented for a telephone visit.   They were referred to the Matthew Gay by a quality report for assistance in managing diabetes.    Subjective: Matthew Gay a a 73 year old with a PMH significant for diabetes with two recent ER/hospital events this month related to hyperglycemia.    Care Team: Primary Care Provider: [name], Matthew K, PA-C ; Next Scheduled Visit: 06/08/2024  Medication Access/Adherence  Current Pharmacy:  [facility] [address], [place] - [address] [place] [address] Phone: [phone] Fax: [phone]   Patient reports affordability concerns with their medications: Yes  $160insulin  at [facility] Patient reports access/transportation concerns to their pharmacy: No  Patient reports adherence concerns with their medications:  No , per patient he has not been forgetting to take now since he has been in the hospital/ER. He does not uses a pillbox and could not articulate how he remembers to take medication.     Diabetes:  Current medications: Humalog  75/25 12 units twice daily, Farxiga  5 mg daily  Current glucose readings:  This morning: 300+ (fasting, when he has been off work) 110-300 Using traditional blood glucose meter; testing twice times daily. He is not interested in CGM if it's going to cost a lot of money.     Current meal patterns:  - Breakfast: egg, sausage, wheat bread (2 slices) - Lunch: did not review  - Supper avoid starches  - Snacks: did not review  - Drinks Water; coffee w/ creamer; stopped alcohol and soda, protein shake daily ensure (30g protein, 125 calories, carbohydrate 7 g)  Patient expressed feeling very stressed about his diet in relation to diabetes management. He shared that he has been making an effort to modify his eating  habits as best as he understands. Patient stated that he was previously advised to stop eating "white starches" such as rice and potatoes.  Provided reassurance and education, explaining that if the provider recommended reducing starches, he can focus on incorporating lower glycemic index fruits (e.g., berries), non-starchy vegetables, and lean proteins into his meals. Emphasized balanced nutrition and gradual, sustainable changes.  Patient is open to seeing a diabetic nutritionist for additional support and guidance. Will follow up with appropriate referral if needed.  Patient was rushing to get off the telephone as he stated I woke him up because he works overnight shift and that he also has an appointment today around 10 am.    Objective:  Lab Results  Component Value Date   HGBA1C 13.6 (H) 05/22/2024    Lab Results  Component Value Date   CREATININE 1.22 05/26/2024   BUN 25 (H) 05/26/2024   NA 129 (L) 05/26/2024   Gay 4.1 05/26/2024   CL 94 (L) 05/26/2024   CO2 24 05/26/2024    Lab Results  Component Value Date   CHOL 172 06/15/2023   HDL 46 06/15/2023   LDLCALC 107 (H) 06/15/2023   TRIG 105 06/15/2023    Medications Reviewed Today     Reviewed by Matthew Gay, Matthew Gay (Matthew Gay) on 06/08/24 at 0910  Med List Status: <None>   Medication Order Taking? Sig Documenting Provider Last Dose Status Informant  atorvastatin  (LIPITOR) 20 MG tablet [id number] Yes Take 1 tablet(s) by mouth at bedtime for cholesterol. Matthew Gay, Matthew POUR, PA-C  Active Pharmacy Records  Blood Glucose Monitoring Suppl (ONE TOUCH ULTRA MINI) w/Device KIT [id number] Yes Use as directed diagnosis e11.65 Matthew Powell BRAVO, Matthew Gay  Active Pharmacy Records  dapagliflozin  propanediol (FARXIGA ) 10 MG TABS tablet [id number] Yes Take 5 mg by mouth daily. Matthew Gay  Active   gabapentin  (NEURONTIN ) 100 MG capsule [id number]  TAKE ONE CAPSULE BY MOUTH TWICE A DAY FOR DIABETIC NEUROPATHY  Patient not  taking: Reported on 06/08/2024   Matthew Fish, Matthew Gay  Active Pharmacy Records  glucose blood test strip [id number] Yes 1 each by Other route 4 (four) times daily. Check blood sugars once daily and as needed.  E11.65 Matthew Ade, Matthew Gay  Active   Insulin  Lispro Prot & Lispro (HUMALOG  MIX 75/25 KWIKPEN) (75-25) 100 UNIT/ML Matthew Gay [id number] Yes Inject 12 Units into the skin 2 (two) times daily before a meal. Matthew Alm DASEN, Matthew Gay  Active   Insulin  Pen Needle 33G X 4 MM MISC [id number] Yes 1 Dose by Does not apply route 2 (two) times daily. Matthew Ade, Matthew Gay  Active   Lancets [facility] ULTRASOFT) lancets [id number] Yes Use as instructed Matthew Ade, Matthew Gay  Active   lisinopril -hydrochlorothiazide  (ZESTORETIC ) 20-12.5 MG tablet [id number] Yes TAKE 1 TABLET BY MOUTH EVERY DAY FOR BLOOD PRESSURE Matthew Gay, Matthew POUR, PA-C  Active Pharmacy Records  traZODone  (DESYREL ) 50 MG tablet [id number]  TAKE 1/2 TO 1 TABLET (25 TO 50 MG TOTAL) BY MOUTH AT BEDTIME  Patient not taking: Reported on 06/08/2024   Matthew Matthew POUR, PA-C  Active Pharmacy Records              Assessment/Plan:   Diabetes: - Currently uncontrolled, likely due to medication noncompliance and cost of medications. While he did not complain about the cost of Farxiga , there is no fill history of medication. It is unclear if the clinic helped him get medication assistance for Farxiga .  - Reviewed long term cardiovascular and renal outcomes of uncontrolled blood sugar - Reviewed goal A1c, goal fasting, and goal 2 hour post prandial glucose - Reviewed dietary modifications including lower glycemic index fruits, protein, veggies; current protein shake is appropriate or Glucerna Protein drink, low carb wraps or Ketobreads for sandwiches, discussed plate and hand method - Recommend to continue current regimen, but will collaborate with PCP that patient may need a dose adjustment. He has an appointment with PCP today.   - Recommend to check  glucose twice daily. Will collaborate with PCP with regard to CGM assessment. - Will for medication assistance to embedded clinical team to help with Humalog  and Farxiga . - Encouraged patient to try to use MyChart when possible   Follow Up Plan: 2 weeks with Matthew Gay (telephone)  - Send patient message on plate/hand method to have for his records.  - Contact Stevphen Ro, CMA about medication assistance for Humalog  and/or Farxiga  - Refill medications: atorvastatin , lisinopril  hydrochlorothiazide  as last filled dates were 01/30/2024 for 90 days supply  Dorcas Solian, PharmD Clinical Matthew Gay Cell: [phone]

## 2024-06-08 NOTE — Progress Notes (Signed)
 [facility] [address] [place], [place] [address]  Internal MEDICINE  Office Visit Note  Patient Name: Matthew Gay  [id number]  [id number]  Date of Service: 06/21/2024  Chief Complaint  Patient presents with   Follow-up   Diabetes   Hypertension   Hyperlipidemia   Quality Metric Gaps    AWV    HPI Pt is here for routine follow up -did go to ED on 05/26/24 when his sugars spiked to 600 and he developed a headache, his insulin  was increased -BG 187, 110, 153, 159, 149 past few days -overall improving -protein shakes now, more water and avoiding sodas -taking 12 units twice per day, may need to increase further and will monitor -will go up to 10mg  Farxiga  now -will see about CGM -Has not had his BP med yet today -interested in seeing nutritionist to learn more about changing diet habits -Also interested in seeing audiology for hearing aids and will place referral  Current Medication: Outpatient Encounter Medications as of 06/08/2024  Medication Sig   Blood Glucose Monitoring Suppl (ONE TOUCH ULTRA MINI) w/Device KIT Use as directed diagnosis e11.65   Continuous Glucose Receiver (DEXCOM G7 RECEIVER) DEVI Use as directed   Continuous Glucose Sensor (DEXCOM G7 SENSOR) MISC Use as directed. Change every 10 days.   dapagliflozin  propanediol (FARXIGA ) 10 MG TABS tablet Take 5 mg by mouth daily.   gabapentin  (NEURONTIN ) 100 MG capsule TAKE ONE CAPSULE BY MOUTH TWICE A DAY FOR DIABETIC NEUROPATHY   glucose blood test strip 1 each by Other route 4 (four) times daily. Check blood sugars once daily and as needed.  E11.65   Insulin  Lispro Prot & Lispro (HUMALOG  MIX 75/25 KWIKPEN) (75-25) 100 UNIT/ML Kwikpen Inject 12 Units into the skin 2 (two) times daily before a meal.   Insulin  Pen Needle 33G X 4 MM MISC 1 Dose by Does not apply route 2 (two) times daily.   Lancets (ONETOUCH ULTRASOFT) lancets Use as instructed   [DISCONTINUED] atorvastatin  (LIPITOR) 20 MG tablet Take  1 tablet(s) by mouth at bedtime for cholesterol.   [DISCONTINUED] lisinopril -hydrochlorothiazide  (ZESTORETIC ) 20-12.5 MG tablet TAKE 1 TABLET BY MOUTH EVERY DAY FOR BLOOD PRESSURE   [DISCONTINUED] traZODone  (DESYREL ) 50 MG tablet TAKE 1/2 TO 1 TABLET (25 TO 50 MG TOTAL) BY MOUTH AT BEDTIME   No facility-administered encounter medications on file as of 06/08/2024.    Surgical History: Past Surgical History:  Procedure Laterality Date   colon cancer     COLON SURGERY      Medical History: Past Medical History:  Diagnosis Date   Diabetes mellitus without complication (HCC)    Hyperlipemia    Hypertension     Family History: Family History  Problem Relation Age of Onset   Hypertension Father     Social History   Socioeconomic History   Marital status: Married    Spouse name: Not on file   Number of children: Not on file   Years of education: Not on file   Highest education level: Not on file  Occupational History   Not on file  Tobacco Use   Smoking status: Former    Types: Cigars   Smokeless tobacco: Never   Tobacco comments:    Quit 2025.  Vaping Use   Vaping status: Never Used  Substance and Sexual Activity   Alcohol use: Yes    Comment: Rarely   Drug use: No   Sexual activity: Not Currently  Other Topics Concern  Not on file  Social History Narrative   Not on file   Social Drivers of Health   Financial Resource Strain: Not on file  Food Insecurity: No Food Insecurity (05/22/2024)   Hunger Vital Sign    Worried About Running Out of Food in the Last Year: Never true    Ran Out of Food in the Last Year: Never true  Transportation Needs: No Transportation Needs (05/22/2024)   PRAPARE - Administrator, Civil Service (Medical): No    Lack of Transportation (Non-Medical): No  Physical Activity: Not on file  Stress: Not on file  Social Connections: Moderately Isolated (05/22/2024)   Social Connection and Isolation Panel    Frequency of Communication  with Friends and Family: Three times a week    Frequency of Social Gatherings with Friends and Family: Three times a week    Attends Religious Services: Never    Active Member of Clubs or Organizations: No    Attends Banker Meetings: Never    Marital Status: Married  Catering manager Violence: Not At Risk (05/22/2024)   Humiliation, Afraid, Rape, and Kick questionnaire    Fear of Current or Ex-Partner: No    Emotionally Abused: No    Physically Abused: No    Sexually Abused: No      Review of Systems  Constitutional:  Negative for chills, fatigue and unexpected weight change.  HENT:  Negative for congestion, postnasal drip, rhinorrhea, sneezing and sore throat.   Eyes:  Negative for redness.  Respiratory:  Negative for cough, chest tightness and shortness of breath.   Cardiovascular:  Negative for chest pain and palpitations.  Gastrointestinal:  Negative for abdominal pain, constipation, diarrhea, nausea and vomiting.  Genitourinary:  Negative for dysuria and frequency.  Musculoskeletal:  Negative for back pain, joint swelling and neck pain.  Skin:  Negative for rash.  Neurological: Negative.  Negative for tremors and numbness.  Hematological:  Negative for adenopathy. Does not bruise/bleed easily.  Psychiatric/Behavioral:  Negative for behavioral problems (Depression), sleep disturbance and suicidal ideas. The patient is not nervous/anxious.     Vital Signs: BP 136/62 Comment: 159/98  Pulse 86   Temp 98.4 F (36.9 C)   Resp 16   Ht 5' 9 (1.753 m)   Wt 158 lb 12.8 oz (72 kg)   SpO2 98%   BMI 23.45 kg/m    Physical Exam Vitals and nursing note reviewed.  Constitutional:      General: He is not in acute distress.    Appearance: Normal appearance. He is well-developed and normal weight. He is not diaphoretic.  HENT:     Head: Normocephalic and atraumatic.  Eyes:     Pupils: Pupils are equal, round, and reactive to light.  Neck:     Thyroid : No  thyromegaly.     Vascular: No JVD.     Trachea: No tracheal deviation.  Cardiovascular:     Rate and Rhythm: Normal rate and regular rhythm.     Heart sounds: Normal heart sounds. No murmur heard.    No friction rub. No gallop.  Pulmonary:     Effort: Pulmonary effort is normal. No respiratory distress.     Breath sounds: No wheezing or rales.  Chest:     Chest wall: No tenderness.  Musculoskeletal:        General: Normal range of motion.  Skin:    General: Skin is warm and dry.  Neurological:     Mental Status: He  is alert and oriented to person, place, and time.  Psychiatric:        Behavior: Behavior normal.        Thought Content: Thought content normal.        Judgment: Judgment normal.        Assessment/Plan: 1. Type 2 diabetes mellitus with hyperglycemia, with long-term current use of insulin  (HCC) (Primary) Will increase to full tab Farxiga  now and continue insulin  12units BID, may need to increase this further pending response. Will also send referral to nutritionist and order CGM for better monitoring - Urine Microalbumin w/creat. ratio - Referral to Nutrition and Diabetes Services - Continuous Glucose Receiver (DEXCOM G7 RECEIVER) DEVI; Use as directed  Dispense: 1 each; Refill: 0 - Continuous Glucose Sensor (DEXCOM G7 SENSOR) MISC; Use as directed. Change every 10 days.  Dispense: 3 each; Refill: 3  2. Bilateral hearing loss, unspecified hearing loss type - Ambulatory referral to Audiology  3. Essential hypertension Continue current medications   General Counseling: Harbert verbalizes understanding of the findings of todays visit and agrees with plan of treatment. I have discussed any further diagnostic evaluation that may be needed or ordered today. We also reviewed his medications today. he has been encouraged to call the office with any questions or concerns that should arise related to todays visit.    Orders Placed This Encounter  Procedures   Urine  Microalbumin w/creat. ratio   Referral to Nutrition and Diabetes Services   Ambulatory referral to Audiology    Meds ordered this encounter  Medications   Continuous Glucose Receiver (DEXCOM G7 RECEIVER) DEVI    Sig: Use as directed    Dispense:  1 each    Refill:  0   Continuous Glucose Sensor (DEXCOM G7 SENSOR) MISC    Sig: Use as directed. Change every 10 days.    Dispense:  3 each    Refill:  3    This patient was seen by Tinnie Pro, PA-C in collaboration with Dr. Sigrid Bathe as a part of collaborative care agreement.   Total time spent:30 Minutes Time spent includes review of chart, medications, test results, and follow up plan with the patient.      Dr Fozia M Khan Internal medicine

## 2024-06-10 LAB — MICROALBUMIN / CREATININE URINE RATIO
Creatinine, Urine: 48.5 mg/dL
Microalb/Creat Ratio: 16 mg/g{creat} (ref 0–29)
Microalbumin, Urine: 7.8 ug/mL

## 2024-06-12 ENCOUNTER — Telehealth: Payer: Self-pay | Admitting: Physician Assistant

## 2024-06-12 NOTE — Telephone Encounter (Signed)
 Awaiting 06/08/24 office notes for Audiology referral-Matthew Gay

## 2024-06-19 ENCOUNTER — Other Ambulatory Visit: Payer: Self-pay

## 2024-06-19 DIAGNOSIS — E782 Mixed hyperlipidemia: Secondary | ICD-10-CM

## 2024-06-19 DIAGNOSIS — F5101 Primary insomnia: Secondary | ICD-10-CM

## 2024-06-19 DIAGNOSIS — I10 Essential (primary) hypertension: Secondary | ICD-10-CM

## 2024-06-19 MED ORDER — LISINOPRIL-HYDROCHLOROTHIAZIDE 20-12.5 MG PO TABS: ORAL_TABLET | ORAL | 0 refills | Status: DC

## 2024-06-19 MED ORDER — ATORVASTATIN CALCIUM 20 MG PO TABS: ORAL_TABLET | ORAL | 1 refills | Status: DC

## 2024-06-19 MED ORDER — TRAZODONE HCL 50 MG PO TABS: ORAL_TABLET | ORAL | 1 refills | Status: DC

## 2024-06-21 MED ORDER — DEXCOM G7 RECEIVER DEVI: 0 refills | Status: DC

## 2024-06-21 MED ORDER — DEXCOM G7 SENSOR MISC: 3 refills | Status: DC

## 2024-06-22 ENCOUNTER — Telehealth: Payer: Self-pay

## 2024-06-22 ENCOUNTER — Telehealth: Payer: Self-pay | Admitting: Physician Assistant

## 2024-06-22 ENCOUNTER — Other Ambulatory Visit: Payer: Medicare (Managed Care)

## 2024-06-22 NOTE — Progress Notes (Signed)
   06/22/2024  Patient ID: Matthew Gay, male   DOB: 28-May-1951, 73 y.o.   MRN: [id number]  Attempted to contact patient for medication management/review. I could not leave HIPAA compliant message for patient to return my call at their convenience.    Will follow up with patient in 1-2 weeks.  Thank you for allowing pharmacy to be a part of this patient's care.  Dorcas Solian, PharmD Clinical Pharmacist Cell: [phone]

## 2024-06-22 NOTE — Telephone Encounter (Addendum)
 Audiology referral sent via [facility] to [facility]. Attempted to notify patient. No answer. VM not set up-Toni  Patient returned call, gave him tele #[phone]-Toni

## 2024-06-26 ENCOUNTER — Other Ambulatory Visit: Payer: Self-pay

## 2024-06-26 DIAGNOSIS — Z5986 Financial insecurity: Secondary | ICD-10-CM

## 2024-06-26 NOTE — Progress Notes (Signed)
   06/26/2024 Name: Matthew Gay MRN: [id number] DOB: [birth year]  No chief complaint on file.   KASTEN LEVEQUE is a 73 y.o. year old male who presented for a telephone visit.   They were referred to the pharmacist by a quality report for assistance in managing diabetes.    Subjective: [name] a a 73 year old with a PMH significant for diabetes with two recent ER/hospital events this month related to hyperglycemia.    Care Team: Primary Care Provider: [name], [name], PA-C ; Next Scheduled Visit: 06/08/2024  Medication Access/Adherence  [facility] Pharmacy:  [facility] [address] [place] - [address] [place] [address] Phone: [phone] Fax: [phone]   Patient reports affordability concerns with their medications: Yes  $160insulin  [facility] for [facility] months supply. He is not interested as he is trying to find somewhere to stay to have a secure address. He also stated that atorvastatin  was $40 for 90 days supply. Patient states that he makes less than the amount for couples for LIS application for income and resources. He also states he needs hearing aids through insurance that cost $3-$4k  Patient reports access/transportation concerns to their pharmacy: No  Patient reports adherence concerns with their medications:  No , per patient he denies missing medications     Diabetes:  Current medications: Humalog  75/25 12 units twice daily, Farxiga  5 mg daily  Current glucose readings:  This morning: 200+  110-300 Using traditional blood glucose meter; testing twice times daily. He does not have CGM but potentially would be interested if  approved for LIS.     Current meal patterns:  - Breakfast: egg, sausage, wheat bread (2 slices) - Lunch: did not review  - Supper avoid starches  - Snacks: did not review  - Drinks Water; coffee w/ creamer; stopped alcohol and soda, protein shake daily ensure (30g protein, 125 calories, carbohydrate  7 g)  Patient denies symptoms of hyperglycemia and hypoglycemia.    Objective:  Lab Results  Component Value Date   HGBA1C 13.6 (H) 05/22/2024    Lab Results  Component Value Date   CREATININE 1.22 05/26/2024   BUN 25 (H) 05/26/2024   NA 129 (L) 05/26/2024   K 4.1 05/26/2024   CL 94 (L) 05/26/2024   CO2 24 05/26/2024    Lab Results  Component Value Date   CHOL 172 06/15/2023   HDL 46 06/15/2023   LDLCALC 107 (H) 06/15/2023   TRIG 105 06/15/2023    Medications Reviewed Today   Medications were not reviewed in this encounter       Assessment/Plan:   Diabetes: - Currently uncontrolled. - Recommend to continue current regimen. Future consideration for insulin  regiment dose adjustment.  - Recommend to check glucose twice daily. Will collaborate with PCP for CGM sample at the next in person appointment. - Will follow up regarding medication assistance with embedded clinical team to help with Humalog  and Farxiga .   Misc.  - Patient is open to a referral to help obtain hearing aids at a lower cost.     Follow Up Plan: 1 week with pharmacist (in person) - Will help patient at next in person appointment with LIS application to help lower the cost of medications and CGM - Contact Stevphen Ro, CMA about medication assistance for Humalog  and/or Farxiga   Dorcas Solian, PharmD Clinical Pharmacist Cell: [phone]

## 2024-06-27 ENCOUNTER — Telehealth: Payer: Self-pay | Admitting: *Deleted

## 2024-06-27 NOTE — Progress Notes (Signed)
 Complex Care Management Note Care Guide Note  06/27/2024 Name: Matthew Gay MRN: [id number] DOB: [birth year]   Complex Care Management Outreach Attempts: An unsuccessful telephone outreach was attempted today to offer the patient information about available complex care management services.  Follow Up Plan:  Additional outreach attempts will be made to offer the patient complex care management information and services.   Encounter Outcome:  No Answer  [facility] HealthPopulation Health Care Guide  Direct Dial:[phone] [phone] Website: [email or web address]

## 2024-06-28 ENCOUNTER — Telehealth: Payer: Self-pay | Admitting: Physician Assistant

## 2024-06-28 NOTE — Telephone Encounter (Signed)
 Audiologist appointment 07/20/2024 @ [facility]-Toni

## 2024-06-29 ENCOUNTER — Telehealth: Payer: Self-pay | Admitting: *Deleted

## 2024-06-29 NOTE — Progress Notes (Signed)
 Complex Care Management Note Care Guide Note  06/29/2024 Name: Matthew Gay MRN: [id number] DOB: June 21, 1951   Complex Care Management Outreach Attempts: A second unsuccessful outreach was attempted today to offer the patient with information about available complex care management services.  Follow Up Plan:  Additional outreach attempts will be made to offer the patient complex care management information and services.   Encounter Outcome:  Patient Request to Call Back  Asencion Gell  [facility] HealthPopulation Health Care Guide  Direct [phone] Fax:[phone] Website: [email or web address]

## 2024-06-30 ENCOUNTER — Telehealth: Payer: Self-pay

## 2024-06-30 NOTE — Progress Notes (Signed)
   06/30/2024  Patient ID: Matthew Gay, male   DOB: 1951-01-26, 73 y.o.   MRN: [id number]  I called patient to remind him of in person encounter on 07/03/2024 at 2 pm, but was unable to reach or leave a message. Will follow up again later today.   Dorcas Solian, PharmD Clinical Pharmacist Cell: [phone]

## 2024-07-03 ENCOUNTER — Telehealth: Payer: Self-pay | Admitting: *Deleted

## 2024-07-03 ENCOUNTER — Telehealth: Payer: Self-pay

## 2024-07-03 ENCOUNTER — Other Ambulatory Visit: Payer: Medicare (Managed Care)

## 2024-07-03 NOTE — Progress Notes (Signed)
 Complex Care Management Note Care Guide Note  07/03/2024 Name: Matthew Gay MRN: [id number] DOB: [birth year]   Complex Care Management Outreach Attempts: A second unsuccessful outreach was attempted today to offer the patient with information about available complex care management services.  Follow Up Plan:  Additional outreach attempts will be made to offer the patient complex care management information and services.   Encounter Outcome:  No Answer  [facility] HealthPopulation Health Care Guide  Direct Dial:[phone] Fax:[phone] Website: [email or web address]

## 2024-07-03 NOTE — Progress Notes (Signed)
   07/03/2024  Patient ID: Matthew Gay, male   DOB: Feb 09, 1951, 73 y.o.   MRN: [id number]  I called and spoke with patient to complete the LIS application today. Patient stated that he would like to complete the application tomorrow. Telephone appointment was rescheduled for 1530 tomorrow.   Dorcas Solian, PharmD Clinical Pharmacist Cell: [phone]

## 2024-07-03 NOTE — Progress Notes (Signed)
   07/03/2024  Patient ID: Matthew Gay, male   DOB: [birth year], 73 y.o.   MRN: [id number]  I called patient to remind him of in person encounter today at 2 pm, but was unable to reach or leave a message. Will reschedule for telephone encounter.  Dorcas Solian, PharmD Clinical Pharmacist Cell: [phone]

## 2024-07-04 ENCOUNTER — Other Ambulatory Visit: Payer: Self-pay

## 2024-07-04 ENCOUNTER — Telehealth: Payer: Self-pay | Admitting: *Deleted

## 2024-07-04 NOTE — Progress Notes (Signed)
  Complex Care Management Note Care Guide Note  07/04/2024 Name: DARIK MASSING MRN: [id number] DOB: 19-Nov-1950   Complex Care Management Outreach Attempts: A third unsuccessful outreach was attempted today to offer the patient with information about available complex care management services.  Follow Up Plan: No further attempts   Encounter Outcome:  No Answer  [facility] HealthPopulation Health Care Guide  Direct [phone] Fax:[phone] Website: [email or web address]

## 2024-07-04 NOTE — Progress Notes (Signed)
   07/04/2024  Patient ID: Matthew Gay, male   DOB: November 05, 1951, 73 y.o.   MRN: [id number]  LIS Application - completed   Farxiga  - taking daily from an old prescription (expires 08/01/2023) - d/c old med and get samples. Plans to take bottle to doctor office   Dexcom - he has not picked it up

## 2024-07-04 NOTE — Progress Notes (Incomplete Revision)
   07/04/2024  Patient ID: Matthew Gay, male   DOB: November 05, 1951, 73 y.o.   MRN: [id number]  LIS Application - completed   Farxiga  - taking daily from an old prescription (expires 08/01/2023) - d/c old med and get samples. Plans to take bottle to doctor office   Dexcom - he has not picked it up

## 2024-07-06 ENCOUNTER — Telehealth: Payer: Self-pay

## 2024-07-06 NOTE — Telephone Encounter (Signed)
 Lmom to [facility] pharmacist to call us  back

## 2024-07-11 ENCOUNTER — Other Ambulatory Visit: Payer: Self-pay

## 2024-07-11 DIAGNOSIS — E1165 Type 2 diabetes mellitus with hyperglycemia: Secondary | ICD-10-CM

## 2024-07-11 MED ORDER — GABAPENTIN 100 MG PO CAPS
ORAL_CAPSULE | ORAL | 5 refills | Status: AC
Start: 2024-07-11 — End: ?

## 2024-07-19 ENCOUNTER — Other Ambulatory Visit (HOSPITAL_COMMUNITY): Payer: Self-pay

## 2024-07-19 ENCOUNTER — Telehealth: Payer: Self-pay

## 2024-07-19 ENCOUNTER — Other Ambulatory Visit: Payer: Self-pay

## 2024-07-19 NOTE — Telephone Encounter (Signed)
 Pharmacy Patient Advocate Encounter  Insurance verification completed.   The patient is insured through [employer] test claim for Farxiga . Currently a quantity of 30 is a 30 day supply and the co-pay is $47 .   This test claim was processed through [facility]- copay amounts may vary at other pharmacies due to pharmacy/plan contracts, or as the patient moves through the different stages of their insurance plan.

## 2024-07-19 NOTE — Progress Notes (Signed)
   07/19/2024  Patient ID: Matthew Gay, male   DOB: 01/18/1951, 73 y.o.   MRN: [id number]  Clinical Note - Pharmacy and Medication Update:  Patient was contacted today regarding updates on their Dexcom supplies and medication refills.  Dexcom Supplies: [facility] information for the [facility] receiver and sensors. Per the pharmacy, both the Dexcom sensor and receiver are available at no charge to the patient. The sensor was filled today and is ready for pickup, while the receiver will be available tomorrow as it needed to be ordered.  Medications:  Atorvastatin : Refill is overdue. Patient previously noted cost was expensive. Patient will be advised to refill this medication when possible; if approved for LIS the cost will be significantly lower. Update: However, test claim for atorvastatin  is $0. I called [facility] again. For a 3 - months supply it will cost [facility]6.59 [facility]; refilled medication.   Farxiga  (dapagliflozin ): Patient has been taking an older prescription obtained through medication assistance. Per clinic staff, only Farxiga  10 mg samples are available at this time. Note: Farxiga  cannot be cut or split. A [facility] test claim for a Farxiga  cost $47, though this may vary depending on the dispensing pharmacy. Will need a new prescription sent to pharmacy to determine actual cost; if approved for LIS the cost will be significantly lower.   Insulin  Mix is not ready for a prescription refill.   Insurance/LIS Update: It has been two weeks since the patient completed the Low-Income Subsidy (LIS) application. An application status update may be available by the end of the month.  Plan:  Patient has a PCP appointment scheduled for tomorrow. Plan to collaborate with PCP to address: - Evaluation of ongoing need for Farxiga  and consideration of cost or alternative coverage options.    Will follow up with patient in 3-5 business days.     Dorcas Solian, PharmD Clinical Pharmacist Cell: [phone]

## 2024-07-20 ENCOUNTER — Ambulatory Visit: Payer: Medicare (Managed Care) | Admitting: Physician Assistant

## 2024-07-20 ENCOUNTER — Encounter: Payer: Self-pay | Admitting: Physician Assistant

## 2024-07-20 VITALS — BP 117/60 | HR 93 | Temp 98.0°F | Resp 16 | Ht 69.0 in | Wt 166.2 lb

## 2024-07-20 DIAGNOSIS — E782 Mixed hyperlipidemia: Secondary | ICD-10-CM | POA: Diagnosis not present

## 2024-07-20 DIAGNOSIS — R21 Rash and other nonspecific skin eruption: Secondary | ICD-10-CM | POA: Diagnosis not present

## 2024-07-20 DIAGNOSIS — E1165 Type 2 diabetes mellitus with hyperglycemia: Secondary | ICD-10-CM | POA: Diagnosis not present

## 2024-07-20 DIAGNOSIS — Z794 Long term (current) use of insulin: Secondary | ICD-10-CM

## 2024-07-20 MED ORDER — ATORVASTATIN CALCIUM 20 MG PO TABS: ORAL_TABLET | ORAL | 1 refills | Status: AC

## 2024-07-20 MED ORDER — DAPAGLIFLOZIN PROPANEDIOL 10 MG PO TABS: 10.0000 mg | ORAL_TABLET | Freq: Every day | ORAL | 2 refills | Status: AC

## 2024-07-20 NOTE — Progress Notes (Signed)
 [facility] [address] [place], [place] [address]  Internal MEDICINE  Office Visit Note  Patient Name: Matthew Gay  [id number]  [id number]  Date of Service: 07/20/2024  Chief Complaint  Patient presents with   Follow-up   Diabetes   Hypertension   Hyperlipidemia   Quality Metric Gaps    Needs AWV    HPI Pt is here for routine follow up -states sugars still fluctuate, highest in 200s now though--greatly improved from a 600 previously -states he did have a low and drank some pepsi and ate something and it went back up -states rash on left arm and a small area on his face, itchy, will try cortisone cream -wife broke her hip and is in rehab now -will be having eye exam and hearing test both this month -reminded him that dexcom is available at pharmacy and that meds are lower cost now per pharmacist's note. Patient has applied for LIS which should help and will resend farxiga  and lipitor. Also given samples of Farxiga  to help. He had been taking expired Farxiga  which may have impacted efficacy  Current Medication: Outpatient Encounter Medications as of 07/20/2024  Medication Sig   Blood Glucose Monitoring Suppl (ONE TOUCH ULTRA MINI) w/Device KIT Use as directed diagnosis e11.65   Continuous Glucose Receiver (DEXCOM G7 RECEIVER) DEVI Use as directed   Continuous Glucose Sensor (DEXCOM G7 SENSOR) MISC Use as directed. Change every 10 days.   gabapentin  (NEURONTIN ) 100 MG capsule TAKE ONE CAPSULE BY MOUTH TWICE A DAY FOR DIABETIC NEUROPATHY   glucose blood test strip 1 each by Other route 4 (four) times daily. Check blood sugars once daily and as needed.  E11.65   Insulin  Lispro Prot & Lispro (HUMALOG  MIX 75/25 KWIKPEN) (75-25) 100 UNIT/ML Kwikpen Inject 12 Units into the skin 2 (two) times daily before a meal.   Insulin  Pen Needle 33G X 4 MM MISC 1 Dose by Does not apply route 2 (two) times daily.   Lancets (ONETOUCH ULTRASOFT) lancets Use as instructed    lisinopril -hydrochlorothiazide  (ZESTORETIC ) 20-12.5 MG tablet TAKE 1 TABLET BY MOUTH EVERY DAY FOR BLOOD PRESSURE   traZODone  (DESYREL ) 50 MG tablet TAKE 1/2 TO 1 TABLET (25 TO 50 MG TOTAL) BY MOUTH AT BEDTIME   [DISCONTINUED] atorvastatin  (LIPITOR) 20 MG tablet Take 1 tablet(s) by mouth at bedtime for cholesterol.   [DISCONTINUED] dapagliflozin  propanediol (FARXIGA ) 10 MG TABS tablet Take 5 mg by mouth daily.   atorvastatin  (LIPITOR) 20 MG tablet Take 1 tablet(s) by mouth at bedtime for cholesterol.   dapagliflozin  propanediol (FARXIGA ) 10 MG TABS tablet Take 1 tablet (10 mg total) by mouth daily.   [DISCONTINUED] atorvastatin  (LIPITOR) 20 MG tablet Take 1 tablet(s) by mouth at bedtime for cholesterol.   [DISCONTINUED] gabapentin  (NEURONTIN ) 100 MG capsule TAKE ONE CAPSULE BY MOUTH TWICE A DAY FOR DIABETIC NEUROPATHY   [DISCONTINUED] lisinopril -hydrochlorothiazide  (ZESTORETIC ) 20-12.5 MG tablet TAKE 1 TABLET BY MOUTH EVERY DAY FOR BLOOD PRESSURE   [DISCONTINUED] traZODone  (DESYREL ) 50 MG tablet TAKE 1/2 TO 1 TABLET (25 TO 50 MG TOTAL) BY MOUTH AT BEDTIME   No facility-administered encounter medications on file as of 07/20/2024.    Surgical History: Past Surgical History:  Procedure Laterality Date   colon cancer     COLON SURGERY      Medical History: Past Medical History:  Diagnosis Date   Diabetes mellitus without complication (HCC)    Hyperlipemia    Hypertension     Family History: Family History  Problem Relation Age of Onset   Hypertension Father     Social History   Socioeconomic History   Marital status: Married    Spouse name: Not on file   Number of children: Not on file   Years of education: Not on file   Highest education level: Not on file  Occupational History   Not on file  Tobacco Use   Smoking status: Former    Types: Cigars   Smokeless tobacco: Never   Tobacco comments:    Quit 2025.  Vaping Use   Vaping status: Never Used  Substance and Sexual  Activity   Alcohol use: Yes    Comment: Rarely   Drug use: No   Sexual activity: Not Currently  Other Topics Concern   Not on file  Social History Narrative   Not on file   Social Drivers of Health   Financial Resource Strain: Not on file  Food Insecurity: No Food Insecurity (05/22/2024)   Hunger Vital Sign    Worried About Running Out of Food in the Last Year: Never true    Ran Out of Food in the Last Year: Never true  Transportation Needs: No Transportation Needs (05/22/2024)   PRAPARE - Administrator, Civil Service (Medical): No    Lack of Transportation (Non-Medical): No  Physical Activity: Not on file  Stress: Not on file  Social Connections: Moderately Isolated (05/22/2024)   Social Connection and Isolation Panel    Frequency of Communication with Friends and Family: Three times a week    Frequency of Social Gatherings with Friends and Family: Three times a week    Attends Religious Services: Never    Active Member of Clubs or Organizations: No    Attends Banker Meetings: Never    Marital Status: Married  Catering manager Violence: Not At Risk (05/22/2024)   Humiliation, Afraid, Rape, and Kick questionnaire    Fear of Current or Ex-Partner: No    Emotionally Abused: No    Physically Abused: No    Sexually Abused: No      Review of Systems  Constitutional:  Negative for chills, fatigue and unexpected weight change.  HENT:  Negative for congestion, postnasal drip, rhinorrhea, sneezing and sore throat.   Eyes:  Negative for redness.  Respiratory:  Negative for cough, chest tightness and shortness of breath.   Cardiovascular:  Negative for chest pain and palpitations.  Gastrointestinal:  Negative for abdominal pain, constipation, diarrhea, nausea and vomiting.  Genitourinary:  Negative for dysuria and frequency.  Musculoskeletal:  Negative for back pain, joint swelling and neck pain.  Skin:  Positive for rash.  Neurological: Negative.  Negative  for tremors and numbness.  Hematological:  Negative for adenopathy. Does not bruise/bleed easily.  Psychiatric/Behavioral:  Negative for behavioral problems (Depression), sleep disturbance and suicidal ideas. The patient is not nervous/anxious.     Vital Signs: BP 117/60   Pulse 93   Temp 98 F (36.7 C)   Resp 16   Ht 5' 9 (1.753 m)   Wt 166 lb 3.2 oz (75.4 kg)   SpO2 97%   BMI 24.54 kg/m    Physical Exam Vitals and nursing note reviewed.  Constitutional:      General: He is not in acute distress.    Appearance: Normal appearance. He is well-developed and normal weight. He is not diaphoretic.  HENT:     Head: Normocephalic and atraumatic.  Eyes:     Pupils: Pupils are equal,  round, and reactive to light.  Neck:     Thyroid : No thyromegaly.     Vascular: No JVD.     Trachea: No tracheal deviation.  Cardiovascular:     Rate and Rhythm: Normal rate and regular rhythm.     Heart sounds: Normal heart sounds. No murmur heard.    No friction rub. No gallop.  Pulmonary:     Effort: Pulmonary effort is normal. No respiratory distress.     Breath sounds: No wheezing or rales.  Chest:     Chest wall: No tenderness.  Musculoskeletal:        General: Normal range of motion.  Skin:    General: Skin is warm and dry.  Neurological:     Mental Status: He is alert and oriented to person, place, and time.  Psychiatric:        Behavior: Behavior normal.        Thought Content: Thought content normal.        Judgment: Judgment normal.        Assessment/Plan: 1. Type 2 diabetes mellitus with hyperglycemia, with long-term current use of insulin  (HCC) (Primary) Will continue current medications and will start Dexcom for better monitoring that will guide diet and med adjustment in future. Will also help to alert if lows/highs occurring.  2. Mixed hyperlipidemia Will resend - atorvastatin  (LIPITOR) 20 MG tablet; Take 1 tablet(s) by mouth at bedtime for cholesterol.  Dispense: 90  tablet; Refill: 1  3. Rash May use cortisone cream as needed and monitor   General Counseling: Griffon verbalizes understanding of the findings of todays visit and agrees with plan of treatment. I have discussed any further diagnostic evaluation that may be needed or ordered today. We also reviewed his medications today. he has been encouraged to call the office with any questions or concerns that should arise related to todays visit.    No orders of the defined types were placed in this encounter.   Meds ordered this encounter  Medications   atorvastatin  (LIPITOR) 20 MG tablet    Sig: Take 1 tablet(s) by mouth at bedtime for cholesterol.    Dispense:  90 tablet    Refill:  1   dapagliflozin  propanediol (FARXIGA ) 10 MG TABS tablet    Sig: Take 1 tablet (10 mg total) by mouth daily.    Dispense:  30 tablet    Refill:  2    This patient was seen by Tinnie Pro, PA-C in collaboration with Dr. Sigrid Bathe as a part of collaborative care agreement.   Total time spent:30 Minutes Time spent includes review of chart, medications, test results, and follow up plan with the patient.      Dr Fozia M Khan Internal medicine

## 2024-07-28 ENCOUNTER — Other Ambulatory Visit: Payer: Self-pay

## 2024-07-28 MED ORDER — INSULIN PEN NEEDLE 33G X 4 MM MISC: 1.0000 | Freq: Two times a day (BID) | 0 refills | Status: AC

## 2024-08-03 ENCOUNTER — Telehealth: Payer: Self-pay

## 2024-08-03 NOTE — Telephone Encounter (Signed)
 Try call pt no answer he need call [phone] for local income as per pharmacist

## 2024-08-24 ENCOUNTER — Telehealth: Payer: Self-pay | Admitting: Internal Medicine

## 2024-08-24 NOTE — Telephone Encounter (Addendum)
 Received chronic condition verification from [facility]. Gave to dfk for signature-Toni   Signed & faxed back to [facility]; [phone]. Scanned-Toni

## 2024-08-31 ENCOUNTER — Encounter: Payer: Self-pay | Admitting: Physician Assistant

## 2024-08-31 ENCOUNTER — Ambulatory Visit (INDEPENDENT_AMBULATORY_CARE_PROVIDER_SITE_OTHER): Payer: Medicare (Managed Care) | Admitting: Physician Assistant

## 2024-08-31 VITALS — BP 132/68 | HR 78 | Temp 98.0°F | Resp 16 | Ht 69.0 in | Wt 162.0 lb

## 2024-08-31 DIAGNOSIS — Z794 Long term (current) use of insulin: Secondary | ICD-10-CM

## 2024-08-31 DIAGNOSIS — R300 Dysuria: Secondary | ICD-10-CM

## 2024-08-31 DIAGNOSIS — Z1329 Encounter for screening for other suspected endocrine disorder: Secondary | ICD-10-CM

## 2024-08-31 DIAGNOSIS — Z0000 Encounter for general adult medical examination without abnormal findings: Secondary | ICD-10-CM

## 2024-08-31 DIAGNOSIS — Z1211 Encounter for screening for malignant neoplasm of colon: Secondary | ICD-10-CM

## 2024-08-31 DIAGNOSIS — R5383 Other fatigue: Secondary | ICD-10-CM | POA: Diagnosis not present

## 2024-08-31 DIAGNOSIS — Z1212 Encounter for screening for malignant neoplasm of rectum: Secondary | ICD-10-CM

## 2024-08-31 DIAGNOSIS — Z23 Encounter for immunization: Secondary | ICD-10-CM

## 2024-08-31 DIAGNOSIS — E1165 Type 2 diabetes mellitus with hyperglycemia: Secondary | ICD-10-CM | POA: Diagnosis not present

## 2024-08-31 DIAGNOSIS — E782 Mixed hyperlipidemia: Secondary | ICD-10-CM | POA: Diagnosis not present

## 2024-08-31 MED ORDER — DEXCOM G7 SENSOR MISC: 3 refills | Status: AC

## 2024-08-31 MED ORDER — DEXCOM G7 RECEIVER DEVI: 0 refills | Status: AC

## 2024-08-31 NOTE — Progress Notes (Signed)
 [facility] [address] [place], [place] [address]  Internal MEDICINE  Office Visit Note  Patient Name: Matthew Gay  [id number]  [id number]  Date of Service: 08/31/2024  Chief Complaint  Patient presents with   Medicare Wellness   Diabetes   Hypertension   Hyperlipidemia   Quality Metric Gaps    Eye Exam and Colonoscopy    HPI Billyjoe presents for an annual well visit Well-appearing 73 y.o. male Routine CRC screening: overdue and open to referral now Eye exam: missed his eye exam and hearing test and needs to reschedule Labs: will order Other concerns: states he lost his meter and just got a new one. Has not gotten dexcom still despite being told it was covered previously. Will resend. Also states Farxiga  still costly and will give number to coordinator helping with low income application. Given more samples in the meantime     08/31/2024    2:00 PM 05/27/2023    3:15 PM 04/16/2022    2:02 PM  MMSE - Mini Mental State Exam  Orientation to time 5 5 5   Orientation to Place 5 5 5   Registration 3 3 3   Attention/ Calculation 5 5 5   Recall 3 3 3   Language- name 2 objects 2 2 2   Language- repeat 1 1 1   Language- follow 3 step command 3 3 3   Language- read & follow direction 1 1 1   Write a sentence 1 1 1   Copy design 1 1 1   Total score 30 30 30     Functional Status Survey: Is the patient deaf or have difficulty hearing?: Yes Does the patient have difficulty seeing, even when wearing glasses/contacts?: No Does the patient have difficulty concentrating, remembering, or making decisions?: No Does the patient have difficulty walking or climbing stairs?: No Does the patient have difficulty dressing or bathing?: No Does the patient have difficulty doing errands alone such as visiting a doctor's office or shopping?: No     05/27/2023    3:14 PM 07/29/2023    3:01 PM 09/30/2023    1:33 PM 07/20/2024    2:39 PM 08/31/2024    1:59 PM  Fall Risk  Falls in the  past year? 0 0 0 0 0       08/31/2024    1:59 PM  Depression screen PHQ 2/9  Decreased Interest 0  Down, Depressed, Hopeless 0  PHQ - 2 Score 0        No data to display            Current Medication: Outpatient Encounter Medications as of 08/31/2024  Medication Sig   atorvastatin  (LIPITOR) 20 MG tablet Take 1 tablet(s) by mouth at bedtime for cholesterol.   Blood Glucose Monitoring Suppl (ONE TOUCH ULTRA MINI) w/Device KIT Use as directed diagnosis e11.65   dapagliflozin  propanediol (FARXIGA ) 10 MG TABS tablet Take 1 tablet (10 mg total) by mouth daily.   gabapentin  (NEURONTIN ) 100 MG capsule TAKE ONE CAPSULE BY MOUTH TWICE A DAY FOR DIABETIC NEUROPATHY   glucose blood test strip 1 each by Other route 4 (four) times daily. Check blood sugars once daily and as needed.  E11.65   Insulin  Lispro Prot & Lispro (HUMALOG  MIX 75/25 KWIKPEN) (75-25) 100 UNIT/ML Kwikpen Inject 12 Units into the skin 2 (two) times daily before a meal.   Insulin  Pen Needle 33G X 4 MM MISC 1 Dose by Does not apply route 2 (two) times daily.   Lancets (ONETOUCH ULTRASOFT) lancets Use  as instructed   lisinopril -hydrochlorothiazide  (ZESTORETIC ) 20-12.5 MG tablet TAKE 1 TABLET BY MOUTH EVERY DAY FOR BLOOD PRESSURE   traZODone  (DESYREL ) 50 MG tablet TAKE 1/2 TO 1 TABLET (25 TO 50 MG TOTAL) BY MOUTH AT BEDTIME   [DISCONTINUED] Continuous Glucose Receiver (DEXCOM G7 RECEIVER) DEVI Use as directed   [DISCONTINUED] Continuous Glucose Sensor (DEXCOM G7 SENSOR) MISC Use as directed. Change every 10 days.   Continuous Glucose Receiver (DEXCOM G7 RECEIVER) DEVI Use as directed   Continuous Glucose Sensor (DEXCOM G7 SENSOR) MISC Use as directed. Change every 10 days.   No facility-administered encounter medications on file as of 08/31/2024.    Surgical History: Past Surgical History:  Procedure Laterality Date   colon cancer     COLON SURGERY      Medical History: Past Medical History:  Diagnosis Date    Diabetes mellitus without complication (HCC)    Hyperlipemia    Hypertension     Family History: Family History  Problem Relation Age of Onset   Hypertension Father     Social History   Socioeconomic History   Marital status: Married    Spouse name: Not on file   Number of children: Not on file   Years of education: Not on file   Highest education level: Not on file  Occupational History   Not on file  Tobacco Use   Smoking status: Former    Types: Cigars   Smokeless tobacco: Never   Tobacco comments:    Quit 2025.  Vaping Use   Vaping status: Never Used  Substance and Sexual Activity   Alcohol use: Yes    Comment: Rarely   Drug use: No   Sexual activity: Not Currently  Other Topics Concern   Not on file  Social History Narrative   Not on file   Social Drivers of Health   Financial Resource Strain: Not on file  Food Insecurity: No Food Insecurity (05/22/2024)   Hunger Vital Sign    Worried About Running Out of Food in the Last Year: Never true    Ran Out of Food in the Last Year: Never true  Transportation Needs: No Transportation Needs (05/22/2024)   PRAPARE - Administrator, Civil Service (Medical): No    Lack of Transportation (Non-Medical): No  Physical Activity: Not on file  Stress: Not on file  Social Connections: Moderately Isolated (05/22/2024)   Social Connection and Isolation Panel    Frequency of Communication with Friends and Family: Three times a week    Frequency of Social Gatherings with Friends and Family: Three times a week    Attends Religious Services: Never    Active Member of Clubs or Organizations: No    Attends Banker Meetings: Never    Marital Status: Married  Catering manager Violence: Not At Risk (05/22/2024)   Humiliation, Afraid, Rape, and Kick questionnaire    Fear of Current or Ex-Partner: No    Emotionally Abused: No    Physically Abused: No    Sexually Abused: No      Review of Systems   Constitutional:  Negative for chills, fatigue and unexpected weight change.  HENT:  Negative for congestion, postnasal drip, rhinorrhea, sneezing and sore throat.   Eyes:  Negative for redness.  Respiratory:  Negative for cough, chest tightness and shortness of breath.   Cardiovascular:  Negative for chest pain and palpitations.  Gastrointestinal:  Negative for abdominal pain, constipation, diarrhea, nausea and vomiting.  Genitourinary:  Negative for dysuria and frequency.  Musculoskeletal:  Negative for back pain, joint swelling and neck pain.  Skin:  Negative for rash.  Neurological: Negative.  Negative for tremors and numbness.  Hematological:  Negative for adenopathy. Does not bruise/bleed easily.  Psychiatric/Behavioral:  Negative for behavioral problems (Depression), sleep disturbance and suicidal ideas. The patient is not nervous/anxious.     Vital Signs: BP 132/68 Comment: 140/70  Pulse 78   Temp 98 F (36.7 C)   Resp 16   Ht 5' 9 (1.753 m)   Wt 162 lb (73.5 kg)   SpO2 98%   BMI 23.92 kg/m    Physical Exam Vitals and nursing note reviewed.  Constitutional:      General: He is not in acute distress.    Appearance: Normal appearance. He is well-developed and normal weight. He is not diaphoretic.  HENT:     Head: Normocephalic and atraumatic.  Eyes:     Extraocular Movements: Extraocular movements intact.  Neck:     Thyroid : No thyromegaly.     Vascular: No JVD.     Trachea: No tracheal deviation.  Cardiovascular:     Rate and Rhythm: Normal rate and regular rhythm.     Heart sounds: Normal heart sounds. No murmur heard.    No friction rub. No gallop.  Pulmonary:     Effort: Pulmonary effort is normal. No respiratory distress.     Breath sounds: No wheezing or rales.  Chest:     Chest wall: No tenderness.  Skin:    General: Skin is warm and dry.  Neurological:     Mental Status: He is alert and oriented to person, place, and time.  Psychiatric:         Behavior: Behavior normal.        Thought Content: Thought content normal.        Judgment: Judgment normal.        Assessment/Plan: 1. Encounter for annual wellness exam in Medicare patient (Primary) AWV performed, labs ordered, overdue for colonoscopy  2. Type 2 diabetes mellitus with hyperglycemia, with long-term current use of insulin  (HCC) Will reschedule missed eye exam, advised to pick up dexcom for better monitoring - Hgb A1C w/o eAG - Continuous Glucose Receiver (DEXCOM G7 RECEIVER) DEVI; Use as directed  Dispense: 1 each; Refill: 0 - Continuous Glucose Sensor (DEXCOM G7 SENSOR) MISC; Use as directed. Change every 10 days.  Dispense: 3 each; Refill: 3  3. Mixed hyperlipidemia Continue lipitor and will update labs - Lipid Panel With LDL/HDL Ratio  4. Thyroid  disorder screening - TSH + free T4  5. Screening for colorectal cancer - Ambulatory referral to Gastroenterology  6. Other fatigue - Ambulatory referral to Gastroenterology - Comprehensive metabolic panel with GFR - CBC w/Diff/Platelet - Lipid Panel With LDL/HDL Ratio - TSH + free T4  7. Needs flu shot - Influenza, MDCK, trivalent, PF(Flucelvax egg-free)  8. Dysuria - UA/M w/rflx Culture, Routine     General Counseling: Hasson verbalizes understanding of the findings of todays visit and agrees with plan of treatment. I have discussed any further diagnostic evaluation that may be needed or ordered today. We also reviewed his medications today. he has been encouraged to call the office with any questions or concerns that should arise related to todays visit.    Orders Placed This Encounter  Procedures   Microscopic Examination   Influenza, MDCK, trivalent, PF(Flucelvax egg-free)   Comprehensive metabolic panel with GFR   CBC w/Diff/Platelet   Lipid  Panel With LDL/HDL Ratio   TSH + free T4   Hgb A1C w/o eAG   UA/M w/rflx Culture, Routine   Ambulatory referral to Gastroenterology    Meds ordered  this encounter  Medications   Continuous Glucose Receiver (DEXCOM G7 RECEIVER) DEVI    Sig: Use as directed    Dispense:  1 each    Refill:  0   Continuous Glucose Sensor (DEXCOM G7 SENSOR) MISC    Sig: Use as directed. Change every 10 days.    Dispense:  3 each    Refill:  3    Return in about 3 months (around 12/01/2024) for lab review.   Total time spent:35 Minutes Time spent includes review of chart, medications, test results, and follow up plan with the patient.   [place] Controlled Substance Database was reviewed by me.  This patient was seen by Tinnie Pro, PA-C in collaboration with Dr. Sigrid Bathe as a part of collaborative care agreement.  Tinnie Pro, PA-C Internal medicine

## 2024-09-01 LAB — UA/M W/RFLX CULTURE, ROUTINE
Bilirubin, UA: NEGATIVE
Ketones, UA: NEGATIVE
Leukocytes,UA: NEGATIVE
Nitrite, UA: NEGATIVE
Protein,UA: NEGATIVE
RBC, UA: NEGATIVE
Specific Gravity, UA: >=1.03 — AB (ref 1.005–1.030)
Urobilinogen, Ur: 1 mg/dL (ref 0.2–1.0)
pH, UA: 7.5 (ref 5.0–7.5)

## 2024-09-01 LAB — MICROSCOPIC EXAMINATION
Bacteria, UA: NONE SEEN
Casts: NONE SEEN /LPF
Epithelial Cells (non renal): NONE SEEN /HPF (ref 0–10)
RBC, Urine: NONE SEEN /HPF (ref 0–2)
WBC, UA: NONE SEEN /HPF (ref 0–5)

## 2024-09-05 ENCOUNTER — Telehealth: Payer: Self-pay | Admitting: Physician Assistant

## 2024-09-05 NOTE — Telephone Encounter (Signed)
 Gastroenterology referral sent via [facility] to [facility]. Gave patient telephone # [phone]

## 2024-09-07 ENCOUNTER — Telehealth: Payer: Self-pay | Admitting: Physician Assistant

## 2024-09-07 NOTE — Telephone Encounter (Signed)
 Gastroenterology appointment 02/05/2025 with [facility]-Toni

## 2024-09-15 LAB — CBC WITH DIFFERENTIAL/PLATELET
Basophils Absolute: 0.1 x10E3/uL (ref 0.0–0.2)
Basos: 1 %
EOS (ABSOLUTE): 0.2 x10E3/uL (ref 0.0–0.4)
Eos: 3 %
Hematocrit: 43.2 % (ref 37.5–51.0)
Hemoglobin: 13.2 g/dL (ref 13.0–17.7)
Immature Grans (Abs): 0 x10E3/uL (ref 0.0–0.1)
Immature Granulocytes: 0 %
Lymphocytes Absolute: 2.1 x10E3/uL (ref 0.7–3.1)
Lymphs: 31 %
MCH: 26.9 pg (ref 26.6–33.0)
MCHC: 30.6 g/dL — ABNORMAL LOW (ref 31.5–35.7)
MCV: 88 fL (ref 79–97)
Monocytes Absolute: 0.4 x10E3/uL (ref 0.1–0.9)
Monocytes: 6 %
Neutrophils Absolute: 4 x10E3/uL (ref 1.4–7.0)
Neutrophils: 59 %
Platelets: 237 x10E3/uL (ref 150–450)
RBC: 4.9 x10E6/uL (ref 4.14–5.80)
RDW: 13.1 % (ref 11.6–15.4)
WBC: 6.9 x10E3/uL (ref 3.4–10.8)

## 2024-09-15 LAB — LIPID PANEL WITH LDL/HDL RATIO
Cholesterol, Total: 165 mg/dL (ref 100–199)
HDL: 43 mg/dL (ref >39)
LDL Chol Calc (NIH): 98 mg/dL (ref 0–99)
LDL/HDL Ratio: 2.3 ratio (ref 0.0–3.6)
Triglycerides: 138 mg/dL (ref 0–149)
VLDL Cholesterol Cal: 24 mg/dL (ref 5–40)

## 2024-09-15 LAB — COMPREHENSIVE METABOLIC PANEL WITH GFR
ALT: 25 IU/L (ref 0–44)
AST: 10 IU/L (ref 0–40)
Albumin: 4.2 g/dL (ref 3.8–4.8)
Alkaline Phosphatase: 111 IU/L (ref 47–123)
BUN/Creatinine Ratio: 24 (ref 10–24)
BUN: 32 mg/dL — ABNORMAL HIGH (ref 8–27)
Bilirubin Total: 0.3 mg/dL (ref 0.0–1.2)
CO2: 20 mmol/L (ref 20–29)
Calcium: 9.9 mg/dL (ref 8.6–10.2)
Chloride: 95 mmol/L — ABNORMAL LOW (ref 96–106)
Creatinine, Ser: 1.35 mg/dL — ABNORMAL HIGH (ref 0.76–1.27)
Globulin, Total: 2.8 g/dL (ref 1.5–4.5)
Glucose: 403 mg/dL — ABNORMAL HIGH (ref 70–99)
Potassium: 4.9 mmol/L (ref 3.5–5.2)
Sodium: 132 mmol/L — ABNORMAL LOW (ref 134–144)
Total Protein: 7 g/dL (ref 6.0–8.5)
eGFR: 55 mL/min/1.73 — ABNORMAL LOW (ref >59)

## 2024-09-15 LAB — TSH+FREE T4
Free T4: 1.14 ng/dL (ref 0.82–1.77)
TSH: 0.966 u[IU]/mL (ref 0.450–4.500)

## 2024-09-15 LAB — HGB A1C W/O EAG: Hgb A1c MFr Bld: 11.8 % — ABNORMAL HIGH (ref 4.8–5.6)

## 2024-09-21 ENCOUNTER — Other Ambulatory Visit: Payer: Self-pay

## 2024-09-21 DIAGNOSIS — F5101 Primary insomnia: Secondary | ICD-10-CM

## 2024-09-21 DIAGNOSIS — I10 Essential (primary) hypertension: Secondary | ICD-10-CM

## 2024-09-21 MED ORDER — TRAZODONE HCL 50 MG PO TABS: ORAL_TABLET | ORAL | 1 refills | Status: DC

## 2024-09-21 MED ORDER — LISINOPRIL-HYDROCHLOROTHIAZIDE 20-12.5 MG PO TABS: ORAL_TABLET | ORAL | 0 refills | Status: AC

## 2024-10-23 ENCOUNTER — Telehealth: Payer: Self-pay

## 2024-10-24 ENCOUNTER — Other Ambulatory Visit: Payer: Self-pay | Admitting: Physician Assistant

## 2024-10-24 DIAGNOSIS — F5101 Primary insomnia: Secondary | ICD-10-CM

## 2024-10-24 DIAGNOSIS — Z794 Long term (current) use of insulin: Secondary | ICD-10-CM

## 2024-10-24 MED ORDER — TRAZODONE HCL 50 MG PO TABS: ORAL_TABLET | ORAL | 1 refills | Status: AC

## 2024-10-24 MED ORDER — INSULIN LISPRO PROT & LISPRO (75-25 MIX) 100 UNIT/ML KWIKPEN: 12.0000 [IU] | PEN_INJECTOR | Freq: Two times a day (BID) | SUBCUTANEOUS | 2 refills | Status: AC

## 2024-10-24 NOTE — Telephone Encounter (Signed)
Done on other note

## 2024-10-24 NOTE — Telephone Encounter (Signed)
 Tried to call patient, no answer and can't leave a voicemail.

## 2024-10-30 ENCOUNTER — Telehealth: Payer: Self-pay

## 2024-10-30 NOTE — Telephone Encounter (Signed)
 done

## 2024-11-30 ENCOUNTER — Encounter: Payer: Self-pay | Admitting: Physician Assistant

## 2024-11-30 ENCOUNTER — Ambulatory Visit: Admitting: Physician Assistant

## 2024-11-30 VITALS — BP 125/65 | HR 80 | Temp 98.0°F | Resp 16 | Ht 69.0 in | Wt 170.0 lb

## 2024-11-30 DIAGNOSIS — Z794 Long term (current) use of insulin: Secondary | ICD-10-CM | POA: Diagnosis not present

## 2024-11-30 DIAGNOSIS — I10 Essential (primary) hypertension: Secondary | ICD-10-CM | POA: Diagnosis not present

## 2024-11-30 DIAGNOSIS — R7989 Other specified abnormal findings of blood chemistry: Secondary | ICD-10-CM

## 2024-11-30 DIAGNOSIS — E1165 Type 2 diabetes mellitus with hyperglycemia: Secondary | ICD-10-CM | POA: Diagnosis not present

## 2024-11-30 DIAGNOSIS — H9193 Unspecified hearing loss, bilateral: Secondary | ICD-10-CM | POA: Diagnosis not present

## 2024-11-30 MED ORDER — TIRZEPATIDE 2.5 MG/0.5ML ~~LOC~~ SOAJ: 2.5000 mg | SUBCUTANEOUS | 2 refills | Status: AC

## 2024-11-30 NOTE — Progress Notes (Signed)
 [facility] [address] [place], [place] [address]  Internal MEDICINE  Office Visit Note  Patient Name: Matthew Gay  [id number]  [id number]  Date of Service: 11/30/2024  Chief Complaint  Patient presents with   Follow-up    Review labs   Diabetes   Hypertension   Hyperlipidemia   Quality Metric Gaps    Colonoscopy and Eye Exam   Medication Problem    Humalog  is too expensive    HPI Pt is here for routine follow up -States insulin  pen was $75--previously told by pharmacist it would be cheaper; was doing 12units BID--ran out 1 month ago. BG since have fluctuated 160-200s. Also has not completed LIS to help med costs for Farxiga  either and is out of samples. Needs # again for this -states he picked up dexcom, but couldn't figure out how to use and will bring in for education -will be getting hearing aids next month. Also will be following up with dentist. Had bottom dentures, but needs top still -will have eye exam next month as well when new medicare plan takes affect  Current Medication: Outpatient Encounter Medications as of 11/30/2024  Medication Sig   atorvastatin  (LIPITOR) 20 MG tablet Take 1 tablet(s) by mouth at bedtime for cholesterol.   Blood Glucose Monitoring Suppl (ONE TOUCH ULTRA MINI) w/Device KIT Use as directed diagnosis e11.65   Continuous Glucose Receiver (DEXCOM G7 RECEIVER) DEVI Use as directed   Continuous Glucose Sensor (DEXCOM G7 SENSOR) MISC Use as directed. Change every 10 days.   dapagliflozin  propanediol (FARXIGA ) 10 MG TABS tablet Take 1 tablet (10 mg total) by mouth daily.   gabapentin  (NEURONTIN ) 100 MG capsule TAKE ONE CAPSULE BY MOUTH TWICE A DAY FOR DIABETIC NEUROPATHY   glucose blood test strip 1 each by Other route 4 (four) times daily. Check blood sugars once daily and as needed.  E11.65   Insulin  Lispro Prot & Lispro (HUMALOG  MIX 75/25 KWIKPEN) (75-25) 100 UNIT/ML Kwikpen Inject 12 Units into the skin 2 (two) times daily  before a meal.   Insulin  Pen Needle 33G X 4 MM MISC 1 Dose by Does not apply route 2 (two) times daily.   Lancets (ONETOUCH ULTRASOFT) lancets Use as instructed   lisinopril -hydrochlorothiazide  (ZESTORETIC ) 20-12.5 MG tablet TAKE 1 TABLET BY MOUTH EVERY DAY FOR BLOOD PRESSURE   tirzepatide  (MOUNJARO ) 2.5 MG/0.5ML Pen Inject 2.5 mg into the skin once a week.   traZODone  (DESYREL ) 50 MG tablet TAKE 1/2 TO 1 TABLET (25 TO 50 MG TOTAL) BY MOUTH AT BEDTIME   No facility-administered encounter medications on file as of 11/30/2024.    Surgical History: Past Surgical History:  Procedure Laterality Date   colon cancer     COLON SURGERY      Medical History: Past Medical History:  Diagnosis Date   Diabetes mellitus without complication (HCC)    Hyperlipemia    Hypertension     Family History: Family History  Problem Relation Age of Onset   Hypertension Father     Social History   Socioeconomic History   Marital status: Married    Spouse name: Not on file   Number of children: Not on file   Years of education: Not on file   Highest education level: Not on file  Occupational History   Not on file  Tobacco Use   Smoking status: Former    Types: Cigars   Smokeless tobacco: Never   Tobacco comments:    Quit 2025.  Vaping  Use   Vaping status: Never Used  Substance and Sexual Activity   Alcohol use: Yes    Comment: Rarely   Drug use: No   Sexual activity: Not Currently  Other Topics Concern   Not on file  Social History Narrative   Not on file   Social Drivers of Health   Tobacco Use: Medium Risk (11/30/2024)   Patient History    Smoking Tobacco Use: Former    Smokeless Tobacco Use: Never    Passive Exposure: Not on Actuary Strain: Not on file  Food Insecurity: No Food Insecurity (05/22/2024)   Epic    Worried About Programme Researcher, Broadcasting/film/video in the Last Year: Never true    Ran Out of Food in the Last Year: Never true  Transportation Needs: No  Transportation Needs (05/22/2024)   Epic    Lack of Transportation (Medical): No    Lack of Transportation (Non-Medical): No  Physical Activity: Not on file  Stress: Not on file  Social Connections: Moderately Isolated (05/22/2024)   Social Connection and Isolation Panel    Frequency of Communication with Friends and Family: Three times a week    Frequency of Social Gatherings with Friends and Family: Three times a week    Attends Religious Services: Never    Active Member of Clubs or Organizations: No    Attends Banker Meetings: Never    Marital Status: Married  Catering Manager Violence: Not At Risk (05/22/2024)   Epic    Fear of Current or Ex-Partner: No    Emotionally Abused: No    Physically Abused: No    Sexually Abused: No  Depression (PHQ2-9): Low Risk (11/30/2024)   Depression (PHQ2-9)    PHQ-2 Score: 0  Alcohol Screen: Low Risk (11/30/2024)   Alcohol Screen    Last Alcohol Screening Score (AUDIT): 2  Housing: Low Risk (05/22/2024)   Epic    Unable to Pay for Housing in the Last Year: No    Number of Times Moved in the Last Year: 0    Homeless in the Last Year: No  Utilities: Not At Risk (05/22/2024)   Epic    Threatened with loss of utilities: No  Health Literacy: Not on file      Review of Systems  Constitutional:  Negative for chills, fatigue and unexpected weight change.  HENT:  Positive for hearing loss. Negative for congestion, postnasal drip, rhinorrhea, sneezing and sore throat.   Eyes:  Negative for redness.  Respiratory:  Negative for cough, chest tightness and shortness of breath.   Cardiovascular:  Negative for chest pain and palpitations.  Gastrointestinal:  Negative for abdominal pain, constipation, diarrhea, nausea and vomiting.  Genitourinary:  Negative for dysuria and frequency.  Musculoskeletal:  Negative for back pain, joint swelling and neck pain.  Skin:  Negative for rash.  Neurological: Negative.  Negative for tremors and numbness.   Hematological:  Negative for adenopathy. Does not bruise/bleed easily.  Psychiatric/Behavioral:  Negative for behavioral problems (Depression), sleep disturbance and suicidal ideas. The patient is not nervous/anxious.     Vital Signs: BP 125/65   Pulse 80   Temp 98 F (36.7 C)   Resp 16   Ht 5' 9 (1.753 m)   Wt 170 lb (77.1 kg)   SpO2 98%   BMI 25.10 kg/m    Physical Exam Vitals and nursing note reviewed.  Constitutional:      General: He is not in acute distress.  Appearance: Normal appearance. He is well-developed and normal weight. He is not diaphoretic.  HENT:     Head: Normocephalic and atraumatic.  Eyes:     Extraocular Movements: Extraocular movements intact.  Neck:     Thyroid : No thyromegaly.     Vascular: No JVD.     Trachea: No tracheal deviation.  Cardiovascular:     Rate and Rhythm: Normal rate and regular rhythm.     Heart sounds: Normal heart sounds. No murmur heard.    No friction rub. No gallop.  Pulmonary:     Effort: Pulmonary effort is normal. No respiratory distress.     Breath sounds: No wheezing or rales.  Chest:     Chest wall: No tenderness.  Skin:    General: Skin is warm and dry.  Neurological:     Mental Status: He is alert and oriented to person, place, and time.  Psychiatric:        Behavior: Behavior normal.        Thought Content: Thought content normal.        Judgment: Judgment normal.        Assessment/Plan: 1. Type 2 diabetes mellitus with hyperglycemia, with long-term current use of insulin  (HCC) (Primary) Pt will come for Dexcom education with CMA in order to better track sugars. Given # for LIS again to help get medications and will look into availably of [facility] pharmacist to help with meds as well. Will try for mounjaro  if covered for additional BG improvement--given same box with 1 month of medication and CMA assisted in educating on how to use. Also given another month of Farxiga  samples. If insulin  still expensive may  need to try alternative insulin  formulation such as long acting with additional sliding scale coverage - Urine Microalbumin w/creat. ratio - tirzepatide  (MOUNJARO ) 2.5 MG/0.5ML Pen; Inject 2.5 mg into the skin once a week.  Dispense: 2 mL; Refill: 2  2. Elevated serum creatinine Will recheck labs - Basic Metabolic Panel (BMET)  3. Essential hypertension Stable, continue current medications  4. Bilateral hearing loss, unspecified hearing loss type Will be following up for hearing aids next month   General Counseling: Juanmiguel verbalizes understanding of the findings of todays visit and agrees with plan of treatment. I have discussed any further diagnostic evaluation that may be needed or ordered today. We also reviewed his medications today. he has been encouraged to call the office with any questions or concerns that should arise related to todays visit.    Orders Placed This Encounter  Procedures   Urine Microalbumin w/creat. ratio   Basic Metabolic Panel (BMET)    Meds ordered this encounter  Medications   tirzepatide  (MOUNJARO ) 2.5 MG/0.5ML Pen    Sig: Inject 2.5 mg into the skin once a week.    Dispense:  2 mL    Refill:  2    This patient was seen by Tinnie Pro, PA-C in collaboration with Dr. Sigrid Bathe as a part of collaborative care agreement.   Total time spent:30 Minutes Time spent includes review of chart, medications, test results, and follow up plan with the patient.      Dr Fozia M Khan Internal medicine

## 2024-12-01 LAB — BASIC METABOLIC PANEL WITH GFR
BUN/Creatinine Ratio: 18 (ref 10–24)
BUN: 23 mg/dL (ref 8–27)
CO2: 25 mmol/L (ref 20–29)
Calcium: 10.2 mg/dL (ref 8.6–10.2)
Chloride: 95 mmol/L — ABNORMAL LOW (ref 96–106)
Creatinine, Ser: 1.27 mg/dL (ref 0.76–1.27)
Glucose: 351 mg/dL — ABNORMAL HIGH (ref 70–99)
Potassium: 4.8 mmol/L (ref 3.5–5.2)
Sodium: 133 mmol/L — ABNORMAL LOW (ref 134–144)
eGFR: 60 mL/min/1.73

## 2024-12-07 ENCOUNTER — Ambulatory Visit

## 2024-12-14 ENCOUNTER — Ambulatory Visit

## 2024-12-14 NOTE — Progress Notes (Signed)
 Did a Dexcom G7 Primary School Teacher for patient. Also gave patient information on who to contact for patient assistance for refills going forward.

## 2024-12-28 ENCOUNTER — Ambulatory Visit: Admitting: Physician Assistant

## 2025-09-03 ENCOUNTER — Ambulatory Visit: Admitting: Physician Assistant
